# Patient Record
Sex: Female | Born: 1995 | Race: Black or African American | Hispanic: No | Marital: Single | Smoking: Light tobacco smoker
Health system: Southern US, Community
[De-identification: ages and names within clinical notes are randomized; demographics above are authoritative.]

## PROBLEM LIST (undated history)

## (undated) ENCOUNTER — Inpatient Hospital Stay (HOSPITAL_COMMUNITY): Payer: Self-pay

## (undated) DIAGNOSIS — G8929 Other chronic pain: Secondary | ICD-10-CM

## (undated) DIAGNOSIS — R109 Unspecified abdominal pain: Secondary | ICD-10-CM

## (undated) DIAGNOSIS — Z9109 Other allergy status, other than to drugs and biological substances: Secondary | ICD-10-CM

## (undated) DIAGNOSIS — Z531 Procedure and treatment not carried out because of patient's decision for reasons of belief and group pressure: Secondary | ICD-10-CM

## (undated) DIAGNOSIS — G43909 Migraine, unspecified, not intractable, without status migrainosus: Secondary | ICD-10-CM

## (undated) DIAGNOSIS — IMO0001 Reserved for inherently not codable concepts without codable children: Secondary | ICD-10-CM

## (undated) DIAGNOSIS — D649 Anemia, unspecified: Secondary | ICD-10-CM

## (undated) DIAGNOSIS — R51 Headache: Secondary | ICD-10-CM

## (undated) DIAGNOSIS — R569 Unspecified convulsions: Secondary | ICD-10-CM

## (undated) DIAGNOSIS — M549 Dorsalgia, unspecified: Secondary | ICD-10-CM

## (undated) HISTORY — DX: Anemia, unspecified: D64.9

## (undated) HISTORY — DX: Migraine, unspecified, not intractable, without status migrainosus: G43.909

## (undated) HISTORY — PX: DENTAL SURGERY: SHX609

---
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
## 2013-03-05 ENCOUNTER — Ambulatory Visit: Payer: 59 | Admitting: Obstetrics

## 2013-03-06 ENCOUNTER — Encounter: Payer: Self-pay | Admitting: Obstetrics

## 2013-03-06 ENCOUNTER — Ambulatory Visit (INDEPENDENT_AMBULATORY_CARE_PROVIDER_SITE_OTHER): Payer: 59 | Admitting: Obstetrics

## 2013-03-06 DIAGNOSIS — Z3041 Encounter for surveillance of contraceptive pills: Secondary | ICD-10-CM

## 2013-03-06 DIAGNOSIS — Z0000 Encounter for general adult medical examination without abnormal findings: Secondary | ICD-10-CM

## 2013-03-06 MED ORDER — PNV PRENATAL PLUS MULTIVITAMIN 27-1 MG PO TABS: 1.0000 | ORAL_TABLET | Freq: Every day | ORAL | Status: DC

## 2013-03-06 MED ORDER — NORETHINDRONE 0.35 MG PO TABS: 1.0000 | ORAL_TABLET | Freq: Every day | ORAL | Status: DC

## 2013-03-06 NOTE — Progress Notes (Signed)
Subjective:     Rebecca Cervantes is a 18 y.o. female here for a routine exam.  Current complaints: patient in office for a birth control consult. Patient is currently on the mini pill. Patient is still breastfeeding and wants to continue. Patient is interested in changing her birth control method.   Personal health questionnaire reviewed: yes.   Gynecologic History Patient's last menstrual period was 02/23/2013. Contraception: oral progesterone-only contraceptive  Obstetric History OB History  Gravida Para Term Preterm AB SAB TAB Ectopic Multiple Living  1 1 1  0 0 0 0 0 0 1    # Outcome Date GA Lbr Len/2nd Weight Sex Delivery Anes PTL Lv  1 TRM 01/14/12 [id number] 12:04 / 05:53 7 lb 13.9 oz (3.569 kg) M SVD EPI  Y       The following portions of the patient's history were reviewed and updated as appropriate: allergies, current medications, past family history, past medical history, past social history, past surgical history and problem list.  Review of Systems Pertinent items are noted in HPI.    Objective:    No exam performed today, Consultation for contraception..    Assessment:    Contraceptive surveillance.  Wants to continue Micronor.   Plan:    Education reviewed: safe sex/STD prevention. Contraception: oral progesterone-only contraceptive.

## 2013-08-23 ENCOUNTER — Emergency Department (HOSPITAL_COMMUNITY)
Admission: EM | Admit: 2013-08-23 | Discharge: 2013-08-23 | Disposition: A | Discharge status: Home / Self Care | Payer: 59 | Attending: Emergency Medicine | Admitting: Emergency Medicine

## 2013-08-23 ENCOUNTER — Encounter (HOSPITAL_COMMUNITY): Payer: Self-pay | Admitting: Emergency Medicine

## 2013-08-23 ENCOUNTER — Emergency Department (HOSPITAL_COMMUNITY): Payer: 59

## 2013-08-23 DIAGNOSIS — Z862 Personal history of diseases of the blood and blood-forming organs and certain disorders involving the immune mechanism: Secondary | ICD-10-CM | POA: Insufficient documentation

## 2013-08-23 DIAGNOSIS — R112 Nausea with vomiting, unspecified: Secondary | ICD-10-CM | POA: Insufficient documentation

## 2013-08-23 DIAGNOSIS — R1013 Epigastric pain: Secondary | ICD-10-CM | POA: Insufficient documentation

## 2013-08-23 DIAGNOSIS — G8929 Other chronic pain: Secondary | ICD-10-CM | POA: Insufficient documentation

## 2013-08-23 DIAGNOSIS — Z8679 Personal history of other diseases of the circulatory system: Secondary | ICD-10-CM | POA: Insufficient documentation

## 2013-08-23 DIAGNOSIS — Z3202 Encounter for pregnancy test, result negative: Secondary | ICD-10-CM | POA: Insufficient documentation

## 2013-08-23 HISTORY — DX: Unspecified abdominal pain: R10.9

## 2013-08-23 HISTORY — DX: Other chronic pain: G89.29

## 2013-08-23 LAB — POC URINE PREG, ED: Preg Test, Ur: NEGATIVE

## 2013-08-23 LAB — CBC WITH DIFFERENTIAL/PLATELET
BASOS PCT: 1 % (ref 0–1)
Basophils Absolute: 0 10*3/uL (ref 0.0–0.1)
Eosinophils Absolute: 0.2 10*3/uL (ref 0.0–0.7)
Eosinophils Relative: 2 % (ref 0–5)
HCT: 39.4 % (ref 36.0–46.0)
HEMOGLOBIN: 13.7 g/dL (ref 12.0–15.0)
LYMPHS PCT: 41 % (ref 12–46)
Lymphs Abs: 3 10*3/uL (ref 0.7–4.0)
MCH: 33.4 pg (ref 26.0–34.0)
MCHC: 34.8 g/dL (ref 30.0–36.0)
MCV: 96.1 fL (ref 78.0–100.0)
MONOS PCT: 5 % (ref 3–12)
Monocytes Absolute: 0.4 10*3/uL (ref 0.1–1.0)
NEUTROS ABS: 3.8 10*3/uL (ref 1.7–7.7)
Neutrophils Relative %: 51 % (ref 43–77)
Platelets: 221 10*3/uL (ref 150–400)
RBC: 4.1 MIL/uL (ref 3.87–5.11)
RDW: 12.7 % (ref 11.5–15.5)
WBC: 7.3 10*3/uL (ref 4.0–10.5)

## 2013-08-23 LAB — URINALYSIS, ROUTINE W REFLEX MICROSCOPIC
BILIRUBIN URINE: NEGATIVE
GLUCOSE, UA: NEGATIVE mg/dL
HGB URINE DIPSTICK: NEGATIVE
Ketones, ur: 40 mg/dL — AB
Nitrite: NEGATIVE
PH: 8 (ref 5.0–8.0)
Protein, ur: NEGATIVE mg/dL
SPECIFIC GRAVITY, URINE: 1.027 (ref 1.005–1.030)
Urobilinogen, UA: 1 mg/dL (ref 0.0–1.0)

## 2013-08-23 LAB — COMPREHENSIVE METABOLIC PANEL
ALBUMIN: 4.4 g/dL (ref 3.5–5.2)
ALT: 14 U/L (ref 0–35)
AST: 18 U/L (ref 0–37)
Alkaline Phosphatase: 70 U/L (ref 39–117)
Anion gap: 14 (ref 5–15)
BILIRUBIN TOTAL: 0.8 mg/dL (ref 0.3–1.2)
BUN: 12 mg/dL (ref 6–23)
CHLORIDE: 101 meq/L (ref 96–112)
CO2: 25 mEq/L (ref 19–32)
Calcium: 9.2 mg/dL (ref 8.4–10.5)
Creatinine, Ser: 0.82 mg/dL (ref 0.50–1.10)
GFR calc Af Amer: >90 mL/min (ref >90)
GFR calc non Af Amer: >90 mL/min (ref >90)
Glucose, Bld: 79 mg/dL (ref 70–99)
Potassium: 4 mEq/L (ref 3.7–5.3)
Sodium: 140 mEq/L (ref 137–147)
Total Protein: 7.7 g/dL (ref 6.0–8.3)

## 2013-08-23 LAB — URINE MICROSCOPIC-ADD ON

## 2013-08-23 LAB — LIPASE, BLOOD: Lipase: 20 U/L (ref 11–59)

## 2013-08-23 MED ORDER — HYOSCYAMINE SULFATE 0.125 MG PO TABS
0.1250 mg | ORAL_TABLET | Freq: Once | ORAL | Status: DC
  Filled 2013-08-23: qty 1

## 2013-08-23 MED ORDER — GI COCKTAIL ~~LOC~~
30.0000 mL | Freq: Once | ORAL | Status: AC
Start: 2013-08-23 — End: 2013-08-23
  Administered 2013-08-23: 30 mL via ORAL
  Filled 2013-08-23: qty 30

## 2013-08-23 MED ORDER — ONDANSETRON HCL 4 MG/2ML IJ SOLN
4.0000 mg | Freq: Once | INTRAMUSCULAR | Status: AC
  Administered 2013-08-23: 4 mg via INTRAVENOUS
  Filled 2013-08-23: qty 2

## 2013-08-23 MED ORDER — HYOSCYAMINE SULFATE 0.125 MG PO TABS: 0.1250 mg | ORAL_TABLET | Freq: Once | ORAL | Status: DC

## 2013-08-23 MED ORDER — HYOSCYAMINE SULFATE 0.125 MG PO TABS
0.2500 mg | ORAL_TABLET | Freq: Once | ORAL | Status: DC
  Filled 2013-08-23 (×2): qty 2

## 2013-08-23 MED ORDER — SUCRALFATE 1 GM/10ML PO SUSP: 1.0000 g | Freq: Three times a day (TID) | ORAL | Status: DC

## 2013-08-23 MED ORDER — HYOSCYAMINE SULFATE 0.125 MG SL SUBL: 0.1250 mg | SUBLINGUAL_TABLET | SUBLINGUAL | Status: DC | PRN

## 2013-08-23 MED ORDER — FAMOTIDINE 20 MG PO TABS: 20.0000 mg | ORAL_TABLET | Freq: Two times a day (BID) | ORAL | Status: DC

## 2013-08-23 MED ORDER — HYOSCYAMINE SULFATE 0.125 MG SL SUBL
0.2500 mg | SUBLINGUAL_TABLET | SUBLINGUAL | Status: AC
  Administered 2013-08-23: 0.25 mg via SUBLINGUAL

## 2013-08-23 NOTE — ED Provider Notes (Signed)
Medical screening examination/treatment/procedure(s) were performed by non-physician practitioner and as supervising physician I was immediately available for consultation/collaboration.   EKG Interpretation None        [name] [name], MD 08/23/13 2029 

## 2013-08-23 NOTE — Discharge Instructions (Signed)
Start taking pepcid daily. Take carafate as needed. Take levsin as needed. Follow up with gastroenterologist or primary care doctor.    Abdominal Pain Many things can cause abdominal pain. Usually, abdominal pain is not caused by a disease and will improve without treatment. It can often be observed and treated at home. Your health care provider will do a physical exam and possibly order blood tests and X-rays to help determine the seriousness of your pain. However, in many cases, more time must pass before a clear cause of the pain can be found. Before that point, your health care provider may not know if you need more testing or further treatment. HOME CARE INSTRUCTIONS  Monitor your abdominal pain for any changes. The following actions may help to alleviate any discomfort you are experiencing:  Only take over-the-counter or prescription medicines as directed by your health care provider.  Do not take laxatives unless directed to do so by your health care provider.  Try a clear liquid diet (broth, tea, or water) as directed by your health care provider. Slowly move to a bland diet as tolerated. SEEK MEDICAL CARE IF:  You have unexplained abdominal pain.  You have abdominal pain associated with nausea or diarrhea.  You have pain when you urinate or have a bowel movement.  You experience abdominal pain that wakes you in the night.  You have abdominal pain that is worsened or improved by eating food.  You have abdominal pain that is worsened with eating fatty foods.  You have a fever. SEEK IMMEDIATE MEDICAL CARE IF:   Your pain does not go away within 2 hours.  You keep throwing up (vomiting).  Your pain is felt only in portions of the abdomen, such as the right side or the left lower portion of the abdomen.  You pass bloody or black tarry stools. MAKE SURE YOU:  Understand these instructions.   Will watch your condition.   Will get help right away if you are not doing  well or get worse.  Document Released: 11/01/2004 Document Revised: 01/27/2013 Document Reviewed: 10/01/2012 [facility] Patient Information 2015 [employer] [place], [place]. This information is not intended to replace advice given to you by your health care provider. Make sure you discuss any questions you have with your health care provider.

## 2013-08-23 NOTE — ED Provider Notes (Signed)
CSN: [id number]     Arrival date & time 08/23/13  0841 History   First MD Initiated Contact with Patient 08/23/13 [id number]     Chief Complaint  Patient presents with  . Abdominal Pain  . Emesis     (Consider location/radiation/quality/duration/timing/severity/associated sxs/prior Treatment) HPI Rebecca Cervantes is a 18 y.o. female who presents to emergency department complaining of abdominal pain. Patient with chronic abdominal issues, states has had problems with her abdomen for 5 years. Has been seen at [facility] for the same, had an endoscopy and multiple evaluations a year ago. Was told everything was normal. She was started on PPIs which she's not taking at this time. States she has pain daily, but able to manage it, states that the pain worsened in the last 3 days. Patient reports episode of emesis yesterday and today. She reports nausea. Normal bowel movement yesterday. Denies any urinary or vaginal complaints. No possibility of being pregnant. Did not try taking any medications for the same. Pain is mainly epigastric, does not radiate. It is sharp. States she is able to eat and drink well. Nothing making symptoms better or worse. No prior abdominal surgeries.  Past Medical History  Diagnosis Date  . Environmental allergies   . Chronic back pain   . Headache(784.0)     Migraines  . Seizures     last one 2 years ago  . Refusal of blood transfusions as patient is Jehovah's Witness   . Anemia   . Chronic abdominal pain    Past Surgical History  Procedure Laterality Date  . Dental surgery     Family History  Problem Relation Age of Onset  . Asthma Mother   . Hypertension Mother   . Other Mother     woke up with anesthesia  . Hypertension Sister   . Hypertension Maternal Grandmother    History  Substance Use Topics  . Smoking status: Never Smoker   . Smokeless tobacco: Never Used  . Alcohol Use: No   OB History   Grav Para Term Preterm Abortions TAB SAB  Ect Mult Living   1 1 1  0 0 0 0 0 0 1     Review of Systems  Constitutional: Negative for fever and chills.  Respiratory: Negative for cough, chest tightness and shortness of breath.   Cardiovascular: Negative for chest pain, palpitations and leg swelling.  Gastrointestinal: Positive for nausea, vomiting and abdominal pain. Negative for diarrhea.  Genitourinary: Negative for dysuria, flank pain, vaginal bleeding, vaginal discharge, vaginal pain and pelvic pain.  Musculoskeletal: Negative for arthralgias, myalgias, neck pain and neck stiffness.  Skin: Negative for rash.  Neurological: Negative for dizziness, weakness and headaches.  All other systems reviewed and are negative.     Allergies  Morphine and related  Home Medications   Prior to Admission medications   Medication Sig Start Date End Date Taking? Authorizing Provider  PRESCRIPTION MEDICATION Take 1 tablet by mouth daily. "minipill" birth control   Yes Historical Provider, MD   BP 110/86  Temp(Src) 98.3 F (36.8 C) (Oral)  Resp 21  SpO2 100%  LMP 08/02/2013 Physical Exam  Nursing note and vitals reviewed. Constitutional: She appears well-developed and well-nourished. No distress.  HENT:  Head: Normocephalic.  Eyes: Conjunctivae are normal.  Neck: Neck supple.  Cardiovascular: Normal rate, regular rhythm and normal heart sounds.   Pulmonary/Chest: Effort normal and breath sounds normal. No respiratory distress. She has no wheezes. She has no rales.  Abdominal:  Soft. Bowel sounds are normal. She exhibits no distension. There is tenderness. There is no rebound and no guarding.  Epigastric tenderness  Musculoskeletal: She exhibits no edema.  Neurological: She is alert.  Skin: Skin is warm and dry.  Psychiatric: She has a normal mood and affect. Her behavior is normal.    ED Course  Procedures (including critical care time) Labs Review Labs Reviewed  URINALYSIS, ROUTINE W REFLEX MICROSCOPIC - Abnormal; Notable  for the following:    Color, Urine AMBER (*)    Ketones, ur 40 (*)    Leukocytes, UA SMALL (*)    All other components within normal limits  URINE MICROSCOPIC-ADD ON - Abnormal; Notable for the following:    Squamous Epithelial / LPF FEW (*)    All other components within normal limits  CBC WITH DIFFERENTIAL  COMPREHENSIVE METABOLIC PANEL  LIPASE, BLOOD  POC URINE PREG, ED    Imaging Review US Abdomen Complete  08/23/2013   CLINICAL DATA:  Abdominal pain.  Vomiting.  EXAM: ULTRASOUND ABDOMEN COMPLETE  COMPARISON:  None.  FINDINGS: Gallbladder:  No gallstones or wall thickening visualized. No sonographic Murphy sign noted.  Common bile duct:  Diameter: 1.4 mm  Liver:  No focal lesion identified. Within normal limits in parenchymal echogenicity.  IVC:  No abnormality visualized.  Pancreas:  Visualized portion unremarkable.  Spleen:  Size and appearance within normal limits.  Right Kidney:  Length: 9.1 cm. Echogenicity within normal limits. No mass or hydronephrosis visualized.  Left Kidney:  Length: 9.5 cm. Echogenicity within normal limits. No mass or hydronephrosis visualized.  Abdominal aorta:  No aneurysm visualized.  Other findings:  None.  IMPRESSION: Normal abdominal ultrasound.   Electronically Signed   By: Amie Portland M.D.   On: 08/23/2013 13:11     EKG Interpretation None      MDM   Final diagnoses:  Epigastric pain   Patient epigastric abdominal pain for "years." Worsened in the last several days. Recent endoscopy last year which was normal. She was diagnosed with acid reflux in the past. She reports normal bowel movements. We'll try GI cocktail and Levsin.  11:18 AM Pt feeling better after levsin and GI cocktail. Will get [place]. Labs and UA normal.    1:29 PM [place] normal. Discussed with pt, starting back on pepcid, carafate, and levsin. Pt will need follow up with her PCp and GI. Her GI doctor is in [place], but she lives here now. Requested referral to GI specialist in  [place].   Filed Vitals:   08/23/13 1000 08/23/13 1130 08/23/13 1200 08/23/13 1337  BP: 109/61 101/62 99/59 98/58   Pulse: 63 71 56   Temp:      TempSrc:      Resp: 18 18 17 14   SpO2: 100% 99% 100% 100%     Lottie Mussel, PA-C 08/23/13 1533

## 2013-08-23 NOTE — ED Notes (Signed)
Nurse was informed pt. In room.

## 2013-08-23 NOTE — ED Notes (Signed)
Patient transported to Ultrasound 

## 2013-08-23 NOTE — ED Notes (Signed)
Pt from home with c/o chronic abdominal pain worsening the last several days, vomiting started last night.  Denies diarrhea, reports hx of same x 5 years with no dx.  Intermittently sees GI dr.  Pt in NAD, A&O.

## 2013-11-10 ENCOUNTER — Ambulatory Visit: Payer: 59 | Admitting: Obstetrics

## 2013-11-22 ENCOUNTER — Encounter (HOSPITAL_COMMUNITY): Payer: Self-pay | Admitting: Emergency Medicine

## 2013-11-22 DIAGNOSIS — R110 Nausea: Secondary | ICD-10-CM | POA: Diagnosis not present

## 2013-11-22 DIAGNOSIS — G8929 Other chronic pain: Secondary | ICD-10-CM | POA: Insufficient documentation

## 2013-11-22 DIAGNOSIS — R1010 Upper abdominal pain, unspecified: Secondary | ICD-10-CM | POA: Insufficient documentation

## 2013-11-22 LAB — CBC WITH DIFFERENTIAL/PLATELET
BASOS ABS: 0 10*3/uL (ref 0.0–0.1)
BASOS PCT: 1 % (ref 0–1)
Eosinophils Absolute: 0.1 10*3/uL (ref 0.0–0.7)
Eosinophils Relative: 2 % (ref 0–5)
HCT: 36.9 % (ref 36.0–46.0)
Hemoglobin: 12.4 g/dL (ref 12.0–15.0)
Lymphocytes Relative: 45 % (ref 12–46)
Lymphs Abs: 2.7 10*3/uL (ref 0.7–4.0)
MCH: 32 pg (ref 26.0–34.0)
MCHC: 33.6 g/dL (ref 30.0–36.0)
MCV: 95.3 fL (ref 78.0–100.0)
MONO ABS: 0.4 10*3/uL (ref 0.1–1.0)
Monocytes Relative: 7 % (ref 3–12)
NEUTROS ABS: 2.8 10*3/uL (ref 1.7–7.7)
Neutrophils Relative %: 45 % (ref 43–77)
Platelets: 202 10*3/uL (ref 150–400)
RBC: 3.87 MIL/uL (ref 3.87–5.11)
RDW: 12.5 % (ref 11.5–15.5)
WBC: 6 10*3/uL (ref 4.0–10.5)

## 2013-11-22 LAB — COMPREHENSIVE METABOLIC PANEL
ALBUMIN: 3.9 g/dL (ref 3.5–5.2)
ALT: 9 U/L (ref 0–35)
AST: 15 U/L (ref 0–37)
Alkaline Phosphatase: 65 U/L (ref 39–117)
Anion gap: 11 (ref 5–15)
BUN: 13 mg/dL (ref 6–23)
CO2: 26 mEq/L (ref 19–32)
CREATININE: 0.91 mg/dL (ref 0.50–1.10)
Calcium: 9.3 mg/dL (ref 8.4–10.5)
Chloride: 101 mEq/L (ref 96–112)
GFR calc Af Amer: >90 mL/min (ref >90)
GFR calc non Af Amer: >90 mL/min (ref >90)
Glucose, Bld: 88 mg/dL (ref 70–99)
Potassium: 3.5 mEq/L — ABNORMAL LOW (ref 3.7–5.3)
Sodium: 138 mEq/L (ref 137–147)
Total Bilirubin: <0.2 mg/dL — ABNORMAL LOW (ref 0.3–1.2)
Total Protein: 7.7 g/dL (ref 6.0–8.3)

## 2013-11-22 LAB — LIPASE, BLOOD: LIPASE: 18 U/L (ref 11–59)

## 2013-11-22 NOTE — ED Notes (Signed)
C/o upper abd pain that radiates across back x 5 days with nausea.  Denies urinary complaints, vomiting, diarrhea, or constipation.

## 2013-11-23 ENCOUNTER — Emergency Department (HOSPITAL_COMMUNITY)
Admission: EM | Admit: 2013-11-23 | Discharge: 2013-11-23 | Discharge status: Against Medical Advice | Payer: 59 | Attending: Emergency Medicine | Admitting: Emergency Medicine

## 2013-12-07 ENCOUNTER — Encounter (HOSPITAL_COMMUNITY): Payer: Self-pay | Admitting: Emergency Medicine

## 2013-12-07 ENCOUNTER — Encounter: Payer: 59 | Admitting: Family

## 2013-12-07 DIAGNOSIS — Z0289 Encounter for other administrative examinations: Secondary | ICD-10-CM

## 2013-12-08 NOTE — Progress Notes (Signed)
ERROR

## 2013-12-08 NOTE — Patient Instructions (Signed)
ERROR

## 2013-12-28 ENCOUNTER — Encounter: Payer: Self-pay | Admitting: Family

## 2013-12-28 ENCOUNTER — Ambulatory Visit (INDEPENDENT_AMBULATORY_CARE_PROVIDER_SITE_OTHER): Payer: 59 | Admitting: Family

## 2013-12-28 DIAGNOSIS — G8929 Other chronic pain: Secondary | ICD-10-CM

## 2013-12-28 DIAGNOSIS — R109 Unspecified abdominal pain: Secondary | ICD-10-CM

## 2013-12-28 MED ORDER — FAMOTIDINE 20 MG PO TABS: 20.0000 mg | ORAL_TABLET | Freq: Two times a day (BID) | ORAL | Status: DC

## 2013-12-28 NOTE — Progress Notes (Signed)
Pre visit review using our clinic review tool, if applicable. No additional management support is needed unless otherwise documented below in the visit note. 

## 2013-12-28 NOTE — Assessment & Plan Note (Signed)
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 Has not been taking medications secondary to cost. Discussed famotidine with patient and on $4 list. No signs of acute abdomen or changes in bowel habits. Will refer [facility] per patient request. Follow up based on referral.

## 2013-12-28 NOTE — Patient Instructions (Addendum)
Thank you for choosing [facility].  Summary/Instructions:  Your prescription(s) have been submitted to your pharmacy. Please take as directed and contact our office if you believe you are having problem(s) with the medication(s).  Referrals have been made during this visit. You should expect to hear back from our schedulers in about 7-10 days in regards to establishing an appointment with the specialists we discussed.   If your symptoms worsen or fail to improve, please contact our office for further instruction, or in case of emergency go directly to the emergency room at the closest medical facility.     

## 2013-12-28 NOTE — Progress Notes (Signed)
Subjective:    Patient ID: Rebecca Cervantes, female    DOB: 1995/08/01, 18 y.o.   MRN: [id number]  Chief Complaint  Patient presents with  . Establish Care    having problems with stomach pain, would like to be referred to GI, also concerned about being underweight    HPI:  Rebecca Cervantes is a 18 y.o. female who presents today to establish care and discuss stomach issues and inability to gain weight.   1) Stomach issues - chronic stomach issues she was in the 8th grade, a constant pain/soreness rated at 6/10 in intensity. Has been tested for IBS, Chron's. Has had previous imaging and multiple scans. Eating makes the pain worse, so she does not eat until the pain is feeling better. Has previously been seen by Dr. Rae LipsFortunada at [facility]. Able to eat waffles, fruits, crab legs, hot pockets with minimal discomfort. Previously seen by GI and recommended she follow up with [facility], which has not yet occurred. Recent ED visit documentation reviewed.   Allergies  Allergen Reactions  . Morphine And Related Hives   Current Outpatient Prescriptions on File Prior to Visit  Medication Sig Dispense Refill  . PRESCRIPTION MEDICATION Take 1 tablet by mouth daily. "minipill" birth control    . hyoscyamine (LEVSIN/SL) 0.125 MG SL tablet Place 1 tablet (0.125 mg total) under the tongue every 4 (four) hours as needed. (Patient not taking: Reported on 12/28/2013) 30 tablet 0  . sucralfate (CARAFATE) 1 GM/10ML suspension Take 10 mLs (1 g total) by mouth 4 (four) times daily -  with meals and at bedtime. (Patient not taking: Reported on 12/28/2013) 420 mL 0   No current facility-administered medications on file prior to visit.   Past Medical History  Diagnosis Date  . Environmental allergies   . Chronic back pain   . Headache(784.0)     Migraines  . Refusal of blood transfusions as patient is Jehovah's Witness   . Anemia   . Chronic abdominal pain   . Seizures    last one 2 years ago  . Migraine    Family History  Problem Relation Age of Onset  . Asthma Mother   . Hypertension Mother   . Other Mother     woke up with anesthesia  . Hypertension Sister   . Hypertension Maternal Grandmother    History   Social History  . Marital Status: Single    Spouse Name: N/A    Number of Children: 1  . Years of Education: 12   Occupational History  . Unemployed    Social History Main Topics  . Smoking status: Never Smoker   . Smokeless tobacco: Never Used  . Alcohol Use: No  . Drug Use: 2.00 per week    Special: Marijuana  . Sexual Activity:    Partners: Male    Birth Control/ Protection: Pill   Other Topics Concern  . Not on file   Social History Narrative   Born [place] raised in [place], [place]. Lives with her parents and her son. Fun: Get away.    Jevohavah's witness - does not accept blood products.     Review of Systems    See HPI Objective:    BP 112/70 mmHg  Pulse 67  Temp(Src) 98.5 F (36.9 C) (Oral)  Resp 18  Ht 5\' 1"  (1.549 m)  Wt 90 lb 1.9 oz (40.878 kg)  BMI 17.04 kg/m2  SpO2 95% Nursing note and vital  signs reviewed.  Physical Exam  Constitutional: She is oriented to person, place, and time. She appears well-developed. No distress.  Thin appearing female. Seated on the exam table, dressed appropriate, nasal piercing, and appears stated age.   Cardiovascular: Normal rate, regular rhythm, normal heart sounds and intact distal pulses.   Pulmonary/Chest: Effort normal and breath sounds normal.  Abdominal: Normal appearance. There is no hepatosplenomegaly or hepatomegaly. There is generalized tenderness. There is no rigidity, no rebound, no guarding, no CVA tenderness, no tenderness at McBurney's point and negative Murphy's sign.  Neurological: She is alert and oriented to person, place, and time.  Skin: Skin is warm and dry.  Psychiatric: She has a normal mood and affect. Her behavior is normal. Judgment  and thought content normal.       Assessment & Plan:

## 2014-01-14 ENCOUNTER — Telehealth: Payer: Self-pay | Admitting: *Deleted

## 2014-01-14 NOTE — Telephone Encounter (Signed)
Patient called requesting a refill on her birth control.  Attempted to contact the patient and left message with her father for her to call the office.

## 2014-01-20 ENCOUNTER — Other Ambulatory Visit: Payer: Self-pay | Admitting: *Deleted

## 2014-01-20 DIAGNOSIS — Z3041 Encounter for surveillance of contraceptive pills: Secondary | ICD-10-CM

## 2014-01-20 MED ORDER — NORETHINDRONE 0.35 MG PO TABS: 1.0000 | ORAL_TABLET | Freq: Every day | ORAL | Status: DC

## 2014-01-20 NOTE — Telephone Encounter (Signed)
Refills have been forwarded to patient's pharmacy. She does need to make an annual appointment- but it can be after February.

## 2014-03-08 ENCOUNTER — Other Ambulatory Visit: Payer: Self-pay | Admitting: *Deleted

## 2014-03-08 MED ORDER — NORETHINDRONE 0.35 MG PO TABS: 1.0000 | ORAL_TABLET | Freq: Every day | ORAL | Status: DC

## 2014-07-15 ENCOUNTER — Encounter: Payer: Self-pay | Admitting: Family

## 2014-07-15 ENCOUNTER — Telehealth: Payer: Self-pay | Admitting: Family

## 2014-07-15 DIAGNOSIS — Z0289 Encounter for other administrative examinations: Secondary | ICD-10-CM

## 2014-07-15 NOTE — Telephone Encounter (Signed)
May reschedule if she calls back.  

## 2014-07-15 NOTE — Telephone Encounter (Signed)
Patient no showed for cpe. Please advise. °

## 2014-07-16 NOTE — Telephone Encounter (Signed)
Noted  

## 2014-08-16 ENCOUNTER — Emergency Department (HOSPITAL_COMMUNITY)
Admission: EM | Admit: 2014-08-16 | Discharge: 2014-08-16 | Disposition: A | Discharge status: Home / Self Care | Payer: BLUE CROSS/BLUE SHIELD | Source: Home / Self Care | Attending: Emergency Medicine | Admitting: Emergency Medicine

## 2014-08-16 ENCOUNTER — Encounter (HOSPITAL_COMMUNITY): Payer: Self-pay

## 2014-08-16 ENCOUNTER — Telehealth: Payer: Self-pay | Admitting: Family

## 2014-08-16 ENCOUNTER — Observation Stay (HOSPITAL_COMMUNITY)
Admission: EM | Admit: 2014-08-16 | Discharge: 2014-08-17 | Discharge status: Against Medical Advice | Payer: BLUE CROSS/BLUE SHIELD | Attending: Internal Medicine | Admitting: Internal Medicine

## 2014-08-16 ENCOUNTER — Observation Stay (HOSPITAL_COMMUNITY): Payer: BLUE CROSS/BLUE SHIELD

## 2014-08-16 ENCOUNTER — Encounter (HOSPITAL_COMMUNITY): Payer: Self-pay | Admitting: Emergency Medicine

## 2014-08-16 DIAGNOSIS — R569 Unspecified convulsions: Secondary | ICD-10-CM | POA: Diagnosis not present

## 2014-08-16 DIAGNOSIS — G43909 Migraine, unspecified, not intractable, without status migrainosus: Secondary | ICD-10-CM | POA: Diagnosis not present

## 2014-08-16 DIAGNOSIS — M549 Dorsalgia, unspecified: Secondary | ICD-10-CM | POA: Diagnosis not present

## 2014-08-16 DIAGNOSIS — F1210 Cannabis abuse, uncomplicated: Secondary | ICD-10-CM | POA: Insufficient documentation

## 2014-08-16 DIAGNOSIS — G8929 Other chronic pain: Secondary | ICD-10-CM

## 2014-08-16 DIAGNOSIS — F445 Conversion disorder with seizures or convulsions: Secondary | ICD-10-CM | POA: Insufficient documentation

## 2014-08-16 DIAGNOSIS — Z862 Personal history of diseases of the blood and blood-forming organs and certain disorders involving the immune mechanism: Secondary | ICD-10-CM

## 2014-08-16 DIAGNOSIS — E876 Hypokalemia: Secondary | ICD-10-CM | POA: Diagnosis not present

## 2014-08-16 DIAGNOSIS — R4182 Altered mental status, unspecified: Secondary | ICD-10-CM | POA: Insufficient documentation

## 2014-08-16 DIAGNOSIS — Z8679 Personal history of other diseases of the circulatory system: Secondary | ICD-10-CM

## 2014-08-16 DIAGNOSIS — Z885 Allergy status to narcotic agent status: Secondary | ICD-10-CM | POA: Insufficient documentation

## 2014-08-16 DIAGNOSIS — Z79899 Other long term (current) drug therapy: Secondary | ICD-10-CM

## 2014-08-16 DIAGNOSIS — D649 Anemia, unspecified: Secondary | ICD-10-CM | POA: Diagnosis not present

## 2014-08-16 DIAGNOSIS — F449 Dissociative and conversion disorder, unspecified: Secondary | ICD-10-CM

## 2014-08-16 LAB — CBC WITH DIFFERENTIAL/PLATELET
Basophils Absolute: 0 10*3/uL (ref 0.0–0.1)
Basophils Relative: 1 % (ref 0–1)
Eosinophils Absolute: 0.2 10*3/uL (ref 0.0–0.7)
Eosinophils Relative: 3 % (ref 0–5)
HCT: 42.6 % (ref 36.0–46.0)
Hemoglobin: 15.2 g/dL — ABNORMAL HIGH (ref 12.0–15.0)
Lymphocytes Relative: 45 % (ref 12–46)
Lymphs Abs: 3.5 10*3/uL (ref 0.7–4.0)
MCH: 34.5 pg — ABNORMAL HIGH (ref 26.0–34.0)
MCHC: 35.7 g/dL (ref 30.0–36.0)
MCV: 96.6 fL (ref 78.0–100.0)
Monocytes Absolute: 0.5 10*3/uL (ref 0.1–1.0)
Monocytes Relative: 6 % (ref 3–12)
Neutro Abs: 3.6 10*3/uL (ref 1.7–7.7)
Neutrophils Relative %: 45 % (ref 43–77)
Platelets: 206 10*3/uL (ref 150–400)
RBC: 4.41 MIL/uL (ref 3.87–5.11)
RDW: 12.1 % (ref 11.5–15.5)
WBC: 7.8 10*3/uL (ref 4.0–10.5)

## 2014-08-16 LAB — BASIC METABOLIC PANEL
Anion gap: 11 (ref 5–15)
BUN: 12 mg/dL (ref 6–20)
CO2: 23 mmol/L (ref 22–32)
Calcium: 9.4 mg/dL (ref 8.9–10.3)
Chloride: 107 mmol/L (ref 101–111)
Creatinine, Ser: 0.95 mg/dL (ref 0.44–1.00)
GFR calc Af Amer: >60 mL/min (ref >60)
GFR calc non Af Amer: >60 mL/min (ref >60)
Glucose, Bld: 80 mg/dL (ref 65–99)
Potassium: 3.3 mmol/L — ABNORMAL LOW (ref 3.5–5.1)
Sodium: 141 mmol/L (ref 135–145)

## 2014-08-16 LAB — PREGNANCY, URINE: PREG TEST UR: NEGATIVE

## 2014-08-16 LAB — RAPID URINE DRUG SCREEN, HOSP PERFORMED
AMPHETAMINES: NOT DETECTED
BENZODIAZEPINES: POSITIVE — AB
Barbiturates: NOT DETECTED
Cocaine: NOT DETECTED
Opiates: NOT DETECTED
Tetrahydrocannabinol: POSITIVE — AB

## 2014-08-16 MED ORDER — SODIUM CHLORIDE 0.9 % IV SOLN
INTRAVENOUS | Status: DC
  Administered 2014-08-16: 22:00:00 via INTRAVENOUS

## 2014-08-16 MED ORDER — FAMOTIDINE 20 MG PO TABS
20.0000 mg | ORAL_TABLET | Freq: Two times a day (BID) | ORAL | Status: DC
  Administered 2014-08-16: 20 mg via ORAL
  Filled 2014-08-16: qty 1

## 2014-08-16 MED ORDER — FOLIC ACID 1 MG PO TABS: 1.0000 mg | ORAL_TABLET | Freq: Every day | ORAL | Status: DC

## 2014-08-16 MED ORDER — KETOROLAC TROMETHAMINE 15 MG/ML IJ SOLN
15.0000 mg | Freq: Once | INTRAMUSCULAR | Status: AC
  Administered 2014-08-16: 15 mg via INTRAVENOUS
  Filled 2014-08-16: qty 1

## 2014-08-16 MED ORDER — NORETHINDRONE 0.35 MG PO TABS: 1.0000 | ORAL_TABLET | Freq: Every day | ORAL | Status: DC

## 2014-08-16 MED ORDER — HYDROMORPHONE HCL 2 MG/ML IJ SOLN: 2.0000 mg | Freq: Once | INTRAMUSCULAR | Status: DC

## 2014-08-16 MED ORDER — POTASSIUM CHLORIDE CRYS ER 20 MEQ PO TBCR
40.0000 meq | EXTENDED_RELEASE_TABLET | Freq: Once | ORAL | Status: AC
  Administered 2014-08-16: 40 meq via ORAL
  Filled 2014-08-16: qty 2

## 2014-08-16 MED ORDER — VITAMIN B-1 100 MG PO TABS
100.0000 mg | ORAL_TABLET | Freq: Every day | ORAL | Status: DC
  Filled 2014-08-16: qty 1

## 2014-08-16 MED ORDER — ACETAMINOPHEN 650 MG RE SUPP: 650.0000 mg | Freq: Four times a day (QID) | RECTAL | Status: DC | PRN

## 2014-08-16 MED ORDER — ADULT MULTIVITAMIN W/MINERALS CH
1.0000 | ORAL_TABLET | Freq: Every day | ORAL | Status: DC
  Filled 2014-08-16: qty 1

## 2014-08-16 MED ORDER — LORAZEPAM 2 MG/ML IJ SOLN
1.0000 mg | Freq: Once | INTRAMUSCULAR | Status: AC
  Administered 2014-08-16: 1 mg via INTRAVENOUS
  Filled 2014-08-16: qty 1

## 2014-08-16 MED ORDER — SODIUM CHLORIDE 0.9 % IJ SOLN
3.0000 mL | Freq: Two times a day (BID) | INTRAMUSCULAR | Status: DC
  Administered 2014-08-16: 3 mL via INTRAVENOUS

## 2014-08-16 MED ORDER — ACETAMINOPHEN 325 MG PO TABS
650.0000 mg | ORAL_TABLET | Freq: Four times a day (QID) | ORAL | Status: DC | PRN
  Administered 2014-08-16: 650 mg via ORAL
  Filled 2014-08-16: qty 2

## 2014-08-16 MED ORDER — ONDANSETRON 4 MG PO TBDP: 4.0000 mg | ORAL_TABLET | Freq: Once | ORAL | Status: DC

## 2014-08-16 NOTE — ED Notes (Signed)
Attempted to draw blood back off IV was not successful. Rebecca Cervantes made aware blood/lab pending

## 2014-08-16 NOTE — ED Notes (Signed)
MD at bedside. Dr Cyril Mourningamillo.

## 2014-08-16 NOTE — ED Notes (Signed)
Per family increased stress at home this week

## 2014-08-16 NOTE — ED Notes (Signed)
MD at bedside. 

## 2014-08-16 NOTE — ED Notes (Signed)
MD at bedside. EDP KOHUT PRESENT 

## 2014-08-16 NOTE — ED Notes (Signed)
Pt here with seizure like activity; pt with eye fluttering and shaking; pt seen at [facility] earlier today for same; pt diagnosed with pseudo seizure; pt not responding at present

## 2014-08-16 NOTE — ED Provider Notes (Signed)
CSN: [id number]     Arrival date & time 08/16/14  1827 History   First MD Initiated Contact with Patient 08/16/14 1847     Chief Complaint  Patient presents with  . Seizures    Level V caveat due to altered mental status. (Consider location/radiation/quality/duration/timing/severity/associated sxs/prior Treatment) Patient is a 19 y.o. female presenting with seizures. The history is provided by the patient and a relative.  Seizures  patient was seen earlier today at was a long diagnosis pseudoseizure/conversion. Family and patient were somewhat upset that she was not treated more. Tonight she began to have another episode where she had her eyes laying back and fluttering. She would not move her extremities. Would not talk and would not follow commands. 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 Also has questionable family history of seizures. On my examination she will not talk. Arms will not follow commands. His arm is held up she will hold stiff above her lateral later come down. He'll not drop onto her face. Later she will stare straight ahead but will not speak. She will squeeze hands to commands and when I asked what her name or she shrugged her shoulders.  Past Medical History  Diagnosis Date  . Environmental allergies   . Chronic back pain   . Headache(784.0)     Migraines  . Refusal of blood transfusions as patient is Jehovah's Witness   . Anemia   . Chronic abdominal pain   . Seizures     last one 2 years ago  . Migraine    Past Surgical History  Procedure Laterality Date  . Dental surgery     Family History  Problem Relation Age of Onset  . Asthma Mother   . Hypertension Mother   . Other Mother     woke up with anesthesia  . Hypertension Sister   . Hypertension Maternal Grandmother    History  Substance Use Topics  . Smoking status: Never Smoker   . Smokeless tobacco: Never Used  . Alcohol Use: No   OB History    Gravida Para Term Preterm  AB TAB SAB Ectopic Multiple Living   0 0 0 0 0 0 1     Review of Systems  Unable to perform ROS Neurological: Positive for seizures.      Allergies  Morphine and related  Home Medications   Prior to Admission medications   Medication Sig Start Date End Date Taking? Authorizing Provider  famotidine (PEPCID) 20 MG tablet Take 1 tablet (20 mg total) by mouth 2 (two) times daily. Patient not taking: Reported on 08/16/2014 12/28/13   Veryl Speak, FNP  hyoscyamine (LEVSIN/SL) 0.125 MG SL tablet Place 1 tablet (0.125 mg total) under the tongue every 4 (four) hours as needed. Patient not taking: Reported on 12/28/2013 08/23/13   Tatyana Kirichenko, PA-C  norethindrone (MICRONOR,CAMILA,ERRIN) 0.35 MG tablet Take 1 tablet (0.35 mg total) by mouth daily. Patient not taking: Reported on 08/16/2014 03/08/14   Brock Bad, MD  norethindrone (NORA-BE) 0.35 MG tablet Take 1 tablet by mouth daily.    Historical Provider, MD  sucralfate (CARAFATE) 1 GM/10ML suspension Take 10 mLs (1 g total) by mouth 4 (four) times daily -  with meals and at bedtime. Patient not taking: Reported on 12/28/2013 08/23/13   Tatyana Kirichenko, PA-C   BP 97/61 mmHg  Pulse 65  Temp(Src) 98.5 F (36.9 C) (Oral)  Resp 18  Ht  (1.549 m)  Wt 93 lb 11.2 oz (42.502 kg)  BMI 17.71 kg/m2  SpO2 100%  LMP 08/07/2014 Physical Exam  HENT:  Head: Atraumatic.  Eyes: Pupils are equal, round, and reactive to light.  Patient will only look up and then will only see  Neck: Neck supple.  Cardiovascular: Normal rate.   Pulmonary/Chest: Effort normal and breath sounds normal.  Abdominal: Soft. There is no tenderness.  Musculoskeletal: She exhibits no tenderness.  Neurological: She is alert.  Patient initially just looking up and fluttering her eyes. Face symmetric. Will not follow commands on each side but will hold arms up or into position by placement. She will then lower down. She'll not lower than on her face.  Later will stare ahead and will follow commands but will not speak. Seen by neurology in she's will not talk but will write things out on her phone.  Skin: Skin is warm.    ED Course  Procedures (including critical care time) Labs Review Labs Reviewed  URINE RAPID DRUG SCREEN, HOSP PERFORMED - Abnormal; Notable for the following:    Benzodiazepines POSITIVE (*)    Tetrahydrocannabinol POSITIVE (*)    All other components within normal limits  PREGNANCY, URINE  CBC  COMPREHENSIVE METABOLIC PANEL    Imaging Review Mr Brain Wo Contrast  08/16/2014   CLINICAL DATA:  [age] year old female with seizure-like episodes. Initial encounter.  EXAM: MRI HEAD WITHOUT CONTRAST  TECHNIQUE: Multiplanar, multiecho pulse sequences of the brain and surrounding structures were obtained without intravenous contrast.  COMPARISON:  Head CT without contrast 04/05/2008.  FINDINGS: Cerebral volume is within normal limits. Cavum septum callosum, normal anatomic variant. No ventriculomegaly. No restricted diffusion to suggest acute infarction. No midline shift, mass effect, evidence of mass lesion, extra-axial collection or acute intracranial hemorrhage. Cervicomedullary junction and pituitary are within normal limits. Major intracranial vascular flow voids are within normal limits. Negative visualized cervical spine.  Thin slice coronal T2 imaging of the brain is mildly degraded by motion. Mesial temporal lobe structures appear symmetric and within normal limits. Wallace CullensGray and white matter signal is within normal limits throughout the brain. No encephalomalacia or chronic cerebral blood products identified.  Grossly normal visualized internal auditory structures. Visualized paranasal sinuses and mastoids are clear. Orbits soft tissues appear normal. Visualized scalp soft tissues are within normal limits. Normal bone marrow signal.  IMPRESSION: Normal non contrast MRI appearance of the brain.   Electronically Signed   By: Odessa FlemingH  Hall  M.D.   On: 08/16/2014 21:38     EKG Interpretation None      MDM   Final diagnoses:  Seizure    Patient with altered mental status and question of seizure activity. 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 Discussed with neurology saw the patient who recommended MRI and EEG. Will admit to internal medicine.    Benjiman CoreNathan Temitope Flammer, MD 08/17/14 [phone]

## 2014-08-16 NOTE — ED Notes (Signed)
Bed: AV40WA22 Expected date:  Expected time:  Means of arrival:  Comments: EMS - "seizure" **Room 22**

## 2014-08-16 NOTE — Telephone Encounter (Signed)
Patient Name: Rebecca Cervantes  DOB: [birth year]    Initial Comment Caller states she just got back from [facility] because daughter had a seizure and they didn't run any tests and only gave her meds. Daughter is not making sense when she talks back to her.    Nurse Assessment  Nurse: Annye Englisharmon, RN, Denise Date/Time (Eastern Time): 08/16/2014 5:04:24 PM  Confirm and document reason for call. If symptomatic, describe symptoms. ---Caller states she just got back from [facility] because daughter had a seizure and they didn't run any tests and only gave her meds. Daughter is not making sense when she talks back to her. Pt was seen at ER today for a seizure and the last sz she had was 6 yrs ago. States the ER MD gave her Ativan, Ibuprophen for the HA and unsure of other meds given. States her dtr can write down or text what she wants to say, but is unable to speak intelligently about what she wants to say. States she was trying to say "dressing for a salad", but she said pizza. At the hospital, she was trying to say "headache", but she said "crack in the sun." Did not do testing at the ER but speeck is very impaired.  Has the patient traveled out of the country within the last 30 days? ---Not Applicable  Does the patient require triage? ---Yes  Related visit to physician within the last 2 weeks? ---Yes  Does the PT have any chronic conditions? (i.e. diabetes, asthma, etc.) ---Yes  List chronic conditions. ---Sz disorder  Did the patient indicate they were pregnant? ---No     Guidelines    Guideline Title Affirmed Question Affirmed Notes  Neurologic Deficit Sounds like a life-threatening emergency to the triager Speech impaired as she is unable to speak what she means and is saying something totally different.   Final Disposition User   Call EMS 911 Now Carmon, RN, Angelique BlonderDenise    Comments  Caller states she will not call 911 for amb transport and [facility] car.  States the pt has an appt with Dr Alwyn RenHopper on 08/17/14 at 4pm.   Referrals  [facility] - ED   Disagree/Comply: Disagree  Disagree/Comply Reason: Disagree with instructions

## 2014-08-16 NOTE — ED Provider Notes (Signed)
CSN: [id number]     Arrival date & time 08/16/14  1302 History   First MD Initiated Contact with Patient 08/16/14 1332     Chief Complaint  Patient presents with  . Seizures     (Consider location/radiation/quality/duration/timing/severity/associated sxs/prior Treatment) HPI   18yF with seizure like activity earlier today. Boyfriend reports that they were walking when she said something was wrong and she stopped.  Began to stare blankly. Eyes fluttering. He put he hands on her and asked her what was wrong and "she went stiff as a board." He eased her to a chair. Remained stiff with neck extremely flexed. Did his for 1-2 minutes. Relaxed but remained nonverbal and then resumes same activity again for 1-2 minutes. This repeated until EMS arrived 20 minutes later. No incontinence. No oral trauma. Mother reports history of "seizure" about 5 years ago. Not on meds.   Pt with inappropriate answers to many questions:  Q: Where are we? A: [place].   Q: What year is it? A: [birth year]  Gives prompt, clearly written answers to same questions though.  Q: Do you have any allergies? A: [place].  Then answered question by spelling out [id number].   Past Medical History  Diagnosis Date  . Environmental allergies   . Chronic back pain   . Headache(784.0)     Migraines  . Refusal of blood transfusions as patient is Jehovah's Witness   . Anemia   . Chronic abdominal pain   . Seizures     last one 2 years ago  . Migraine    Past Surgical History  Procedure Laterality Date  . Dental surgery     Family History  Problem Relation Age of Onset  . Asthma Mother   . Hypertension Mother   . Other Mother     woke up with anesthesia  . Hypertension Sister   . Hypertension Maternal Grandmother    History  Substance Use Topics  . Smoking status: Never Smoker   . Smokeless tobacco: Never Used  . Alcohol Use: No   OB History    Gravida Para Term Preterm AB TAB SAB Ectopic Multiple  Living   0 0 0 0 0 0 1     Review of Systems  All systems reviewed and negative, other than as noted in HPI.   Allergies  Morphine and related  Home Medications   Prior to Admission medications   Medication Sig Start Date End Date Taking? Authorizing Provider  norethindrone (NORA-BE) 0.35 MG tablet Take 1 tablet by mouth daily.   Yes Historical Provider, MD  famotidine (PEPCID) 20 MG tablet Take 1 tablet (20 mg total) by mouth 2 (two) times daily. Patient not taking: Reported on 08/16/2014 12/28/13   Veryl Speak, FNP  hyoscyamine (LEVSIN/SL) 0.125 MG SL tablet Place 1 tablet (0.125 mg total) under the tongue every 4 (four) hours as needed. Patient not taking: Reported on 12/28/2013 08/23/13   Tatyana Kirichenko, PA-C  norethindrone (MICRONOR,CAMILA,ERRIN) 0.35 MG tablet Take 1 tablet (0.35 mg total) by mouth daily. 03/08/14   Brock Bad, MD  PRESCRIPTION MEDICATION Take 1 tablet by mouth daily. "minipill" birth control    Historical Provider, MD  sucralfate (CARAFATE) 1 GM/10ML suspension Take 10 mLs (1 g total) by mouth 4 (four) times daily -  with meals and at bedtime. Patient not taking: Reported on 12/28/2013 08/23/13   Tatyana Kirichenko, PA-C   BP 96/57 mmHg  Pulse 59  Temp(Src) 98.1 F (36.7  C) (Oral)  Resp 18  Ht 5\' 1"  (1.549 m)  Wt 94 lb (42.638 kg)  BMI 17.77 kg/m2  SpO2 100%  LMP 08/07/2014 Physical Exam  Constitutional: She appears well-developed and well-nourished. No distress.  HENT:  Head: Normocephalic and atraumatic.  Eyes: Conjunctivae are normal. Pupils are equal, round, and reactive to light. Right eye exhibits no discharge. Left eye exhibits no discharge.  Neck: Neck supple.  Cardiovascular: Normal rate, regular rhythm and normal heart sounds.  Exam reveals no gallop and no friction rub.   No murmur heard. Pulmonary/Chest: Effort normal and breath sounds normal. No respiratory distress.  Abdominal: Soft. She exhibits no distension. There  is no tenderness.  Musculoskeletal: She exhibits no edema or tenderness.  Neurological: She is alert. No cranial nerve deficit. She exhibits normal muscle tone. Coordination normal.  Speech is clear. Content sometimes not appropriate, but inconsistent as noted in HPI.   Skin: Skin is warm and dry.  Psychiatric: She has a normal mood and affect. Her behavior is normal. Thought content normal.  Nursing note and vitals reviewed.   ED Course  Procedures (including critical care time) Labs Review Labs Reviewed  CBC WITH DIFFERENTIAL/PLATELET - Abnormal; Notable for the following:    Hemoglobin 15.2 (*)    MCH 34.5 (*)    All other components within normal limits  BASIC METABOLIC PANEL - Abnormal; Notable for the following:    Potassium 3.3 (*)    All other components within normal limits    Imaging Review No results found.   EKG Interpretation   Date/Time:  Monday August 16 2014 13:12:02 EDT Ventricular Rate:  60 PR Interval:  114 QRS Duration: 94 QT Interval:  444 QTC Calculation: 444 R Axis:   84 Text Interpretation:  Sinus rhythm Atrial premature complex Borderline  short PR interval Non-specific ST-t changes No old tracing to compare  Confirmed by Dyllon Henken  MD, Zabdi Mis ([id number]) on 08/16/2014 3:37:39 PM      MDM   Final diagnoses:  Seizure-like activity  Conversion disorder    18yF with seizure like activity earlier today. Inappropriate verbal answers to some questions, but this is clearly psychogenic. Not expressive aphasia. Pt can clearly given appropriate written responses to same questions. Also able to spell out answers to questions appropriately. Texting on her phone, "Why can I not talk?" Electrolytes fine. I do not feel she needs a CT of her head. Her neuro exam is otherwise normal. With this behavior, I have serious doubts that episode prior to arrival truly was seizure.     Raeford RazorStephen Luvena Wentling, MD 08/21/14 [phone]

## 2014-08-16 NOTE — ED Notes (Signed)
Pt speaking in complete sentences now, informed of need for urine sample and she states to give her a few moments.

## 2014-08-16 NOTE — H&P (Signed)
[facility] History and Physical  Caleen JobsDestini L Hyson ZOX:[id number] DOB: [birth year] DOA: 08/16/2014  Referring physician:  PCP: Jeanine Luzalone, Gregory, FNP   Chief Complaint: Possible Seizure  HPI: Rebecca Cervantes is a 19 y.o. female presents with a possible seizure. Her finacee states that he witness her having a seizure. Patient was outside at the time. She apparently froze in place and fell into his arms. Then she began to thrash about. Patient states that she does not remember. He states that she was flailing around on the bed. She did not foam at the mouth and did not bite her tongue. She did have some gurgling sounds coiming from her though. After the flailing stops she had her eyes open but her fiance states that she was not speaking. [facility]. She states that she felt as though she was in a locked in states when this occureed. Currently she is awake and alert and is joking around with her family and does not appear to be post-ictal.   Review of Systems:  Constitutional:  No weight loss, night sweats.  HEENT:  No headaches, nasal congestion, post nasal drip,  Cardio-vascular:  No chest pain, Orthopnea, PND, swelling in lower extremities  GI:  No heartburn, indigestion, abdominal pain, nausea, vomiting Resp:  No shortness of breath with exertion or at rest. No coughing up of blood.No change in color of mucus Skin:  no rash or lesions.  GU:  no dysuria, change in color of urine, no urgency or frequency  Remainder ROS is unremarkable  Past Medical History  Diagnosis Date  . Environmental allergies   . Chronic back pain   . Headache(784.0)     Migraines  . Refusal of blood transfusions as patient is Jehovah's Witness   . Anemia   . Chronic abdominal pain   . Seizures     last one 2 years ago  . Migraine    Past Surgical History  Procedure Laterality Date  . Dental surgery     Social History:  reports that she  has never smoked. She has never used smokeless tobacco. She reports that she uses illicit drugs (Marijuana) about twice per week. She reports that she does not drink alcohol.  Allergies  Allergen Reactions  . Morphine And Related Hives    Family History  Problem Relation Age of Onset  . Asthma Mother   . Hypertension Mother   . Other Mother     woke up with anesthesia  . Hypertension Sister   . Hypertension Maternal Grandmother      Prior to Admission medications   Medication Sig Start Date End Date Taking? Authorizing Provider  famotidine (PEPCID) 20 MG tablet Take 1 tablet (20 mg total) by mouth 2 (two) times daily. Patient not taking: Reported on 08/16/2014 12/28/13   Veryl SpeakGregory D Calone, FNP  hyoscyamine (LEVSIN/SL) 0.125 MG SL tablet Place 1 tablet (0.125 mg total) under the tongue every 4 (four) hours as needed. Patient not taking: Reported on 12/28/2013 08/23/13   Tatyana Kirichenko, PA-C  norethindrone (MICRONOR,CAMILA,ERRIN) 0.35 MG tablet Take 1 tablet (0.35 mg total) by mouth daily. Patient not taking: Reported on 08/16/2014 03/08/14   Brock Badharles A Harper, MD  norethindrone (NORA-BE) 0.35 MG tablet Take 1 tablet by mouth daily.    Historical Provider, MD  sucralfate (CARAFATE) 1 GM/10ML suspension Take 10 mLs (1 g total) by mouth 4 (four) times daily -  with meals and at bedtime. Patient  not taking: Reported on 12/28/2013 08/23/13   Jaynie Crumble, PA-C   Physical Exam: Filed Vitals:   08/16/14 1915 08/16/14 1930 08/16/14 1945 08/16/14 2003  BP: 101/58 104/64 99/56 103/61  Pulse: 65 62 65 78  Temp:      TempSrc:      Resp: SpO2: 100% 100% 100% 100%    Wt Readings from Last 3 Encounters:  08/16/14 42.638 kg (94 lb)  12/28/13 40.878 kg (90 lb 1.9 oz)  11/22/13 43.545 kg (96 lb)    General:  Appears calm and comfortable Eyes: PERRL, normal lids, irises & conjunctiva ENT: grossly normal hearing, lips & tongue Neck: no LAD, masses or  thyromegaly Cardiovascular: RRR, no m/r/g. No LE edema. Respiratory: CTA bilaterally, no w/r/r. Normal respiratory effort. Abdomen: soft, ntnd Skin: no rash or induration seen on limited exam Musculoskeletal: grossly normal tone BUE/BLE Psychiatric: grossly normal mood and affect, speech fluent and appropriate Neurologic: grossly non-focal.          Labs on Admission:  Basic Metabolic Panel:  Recent Labs Lab 08/16/14 1441  NA 141  K 3.3*  CL 107  CO2 23  GLUCOSE 80  BUN 12  CREATININE 0.95  CALCIUM 9.4   Liver Function Tests: No results for input(s): AST, ALT, ALKPHOS, BILITOT, PROT, ALBUMIN in the last 168 hours. No results for input(s): LIPASE, AMYLASE in the last 168 hours. No results for input(s): AMMONIA in the last 168 hours. CBC:  Recent Labs Lab 08/16/14 1441  WBC 7.8  NEUTROABS 3.6  HGB 15.2*  HCT 42.6  MCV 96.6  PLT 206   Cardiac Enzymes: No results for input(s): CKTOTAL, CKMB, CKMBINDEX, TROPONINI in the last 168 hours.  BNP (last 3 results) No results for input(s): BNP in the last 8760 hours.  ProBNP (last 3 results) No results for input(s): PROBNP in the last 8760 hours.  CBG: No results for input(s): GLUCAP in the last 168 hours.  Radiological Exams on Admission: No results found.    Assessment/Plan Principal Problem:   Seizure Active Problems:   Hypokalemia   Chronic back pain   1. 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  -Neurology has suggested however to get an MRI and also to get and EEG and admit over night for observation -hold off on medications for this at the present time  2. Hypokalemia -will replete as needed -follow up labs in am  3. Chronic Back Pain -pain meds as needed     Code Status: Full Code (must indicate code status--if unknown or must be presumed, indicate so) DVT Prophylaxis:SCD Family  Communication: Father mother (indicate person spoken with, if applicable, with phone number if by telephone) Disposition Plan: Home(indicate anticipated LOS)  Time spent:  [facility] Pager [phone]

## 2014-08-16 NOTE — ED Notes (Signed)
Per [facility]-  Witness seizure by family. Upon arrival EMS observed posturing. Altered mental status. Attempted MPA pt became aware with person, place, time however disoriented to event. No injury to self. At present pt is postictal.

## 2014-08-16 NOTE — Progress Notes (Signed)
RT called to Trauma B by ED Charge RN for pt having seizures. Pt placed on 3L [place] for extra support. Pt spo2 prior to O2 97%, 100% post [place] placement. RT will continue to monitor.

## 2014-08-16 NOTE — Discharge Instructions (Signed)
Seizure, Adult °A seizure is abnormal electrical activity in the brain. Seizures usually last from 30 seconds to 2 minutes. There are various types of seizures. °Before a seizure, you may have a warning sensation (aura) that a seizure is about to occur. An aura may include the following symptoms:  °· Fear or anxiety. °· Nausea. °· Feeling like the room is spinning (vertigo). °· Vision changes, such as seeing flashing lights or spots. °Common symptoms during a seizure include: °· A change in attention or behavior (altered mental status). °· Convulsions with rhythmic jerking movements. °· Drooling. °· Rapid eye movements. °· Grunting. °· Loss of bladder and bowel control. °· Bitter taste in the mouth. °· Tongue biting. °After a seizure, you may feel confused and sleepy. You may also have an injury resulting from convulsions during the seizure. °HOME CARE INSTRUCTIONS  °· If you are given medicines, take them exactly as prescribed by your health care provider. °· Keep all follow-up appointments as directed by your health care provider. °· Do not swim or drive or engage in risky activity during which a seizure could cause further injury to you or others until your health care provider says it is OK. °· Get adequate rest. °· Teach friends and family what to do if you have a seizure. They should: °· Lay you on the ground to prevent a fall. °· Put a cushion under your head. °· Loosen any tight clothing around your neck. °· Turn you on your side. If vomiting occurs, this helps keep your airway clear. °· Stay with you until you recover. °· Know whether or not you need emergency care. °SEEK IMMEDIATE MEDICAL CARE IF: °· The seizure lasts longer than 5 minutes. °· The seizure is severe or you do not wake up immediately after the seizure. °· You have an altered mental status after the seizure. °· You are having more frequent or worsening seizures. °Someone should drive you to the emergency department or call local emergency  services (911 in [place] °MAKE SURE YOU: °· Understand these instructions. °· Will watch your condition. °· Will get help right away if you are not doing well or get worse. °Document Released: 01/20/2000 Document Revised: 11/12/2012 Document Reviewed: 09/03/2012 °[employer]® Patient Information ©2015 [employer]. This information is not intended to replace advice given to you by your health care provider. Make sure you discuss any questions you have with your health care provider. ° °Driving and Equipment Restrictions °Some medical problems make it dangerous to drive, ride a bike, or use machines. Some of these problems are: °· A hard blow to the head (concussion). °· Passing out (fainting). °· Twitching and shaking (seizures). °· Low blood sugar. °· Taking medicine to help you relax (sedatives). °· Taking pain medicines. °· Wearing an eye patch. °· Wearing splints. This can make it hard to use parts of your body that you need to drive safely. °HOME CARE  °· Do not drive until your doctor says it is okay. °· Do not use machines until your doctor says it is okay. °You may need a form signed by your doctor (medical release) before you can drive again. You may also need this form before you do other tasks where you need to be fully alert. °MAKE SURE YOU: °· Understand these instructions. °· Will watch your condition. °· Will get help right away if you are not doing well or get worse. °Document Released: 03/01/2004 Document Revised: 04/16/2011 Document Reviewed: 06/01/2009 °[employer]® Patient Information ©2015 [employer].   This information is not intended to replace advice given to you by your health care provider. Make sure you discuss any questions you have with your health care provider. ° °

## 2014-08-16 NOTE — Consult Note (Signed)
NEURO HOSPITALIST CONSULT NOTE   Referring physician: Dr Alvino Chapel Reason for Consult: seizures  HPI:                                                                                                                                          Rebecca Cervantes is an 19 y.o. female with a past medical history that is relevant for sporadic migraines without aura, chronic back pain, chronic abdominal pain and pseudoseizures, brought in by family for further evaluation of recurrent seizures. Family is at the bedside and informed me that patient had a generalized convulsion this morning and was taken via EMS to [facility] ED where evaluation was ED attending was most consistent with a psychogenic event and patient was discharged home.  She was brought back by family this afternoon and then she was noted to have another seizure in the waiting room described as " eye fluttering and shaking". According to family, patient's seizures today had been characterized by " speech impairment, inability to express anything verbally but she can write well and understands what we are telling her, eyes open, body thrashing and back arching off an on". These seizures reportedly last for 20 minutes off and on. No bladder or bowel impairment, no tongue biting. Patient said that before her seizures today she experienced a warning of " head swimming and when I stood the room moved around and I got off balance". Denies HA but complains of double vision only in the right eye. No focal weakness or numbness or vision impairment. Regarding her seizure history, mother indicated that she had her first seizure was she was 19 years old, had had at least 3 or 4 seizures " of violent body shakiness" before, at some point was taking Lamotrigine for couple of nmonths, but prior seizure work up by a pediatric neurologist was unrevealing and she was diagnosed with pseudoseizures. Mother said that patient was seizure was 5 or 6 years  ago. No particular stressors noted.  Patient was born full at 2 months, product of a normal pregnancy and delivery, no neonatal/perinatal complications and normal development.No epilepsy, febrile seizures, CNS infections, severe head trauma, or stroke. Father with seizures that according to family are " marihuana related". Serologies: K 3.3, white count normal, UDS pending.  Past Medical History  Diagnosis Date  . Environmental allergies   . Chronic back pain   . Headache(784.0)     Migraines  . Refusal of blood transfusions as patient is Jehovah's Witness   . Anemia   . Chronic abdominal pain   . Seizures     last one 2 years ago  . Migraine     Past Surgical History  Procedure Laterality Date  . Dental surgery      Family History  Problem Relation Age of Onset  . Asthma Mother   . Hypertension Mother   . Other Mother     woke up with anesthesia  . Hypertension Sister   . Hypertension Maternal Grandmother     Family History: no brain tumors, brain aneurysms, MS   Social History:  reports that she has never smoked. She has never used smokeless tobacco. She reports that she uses illicit drugs (Marijuana) about twice per week. She reports that she does not drink alcohol.  Allergies  Allergen Reactions  . Morphine And Related Hives    MEDICATIONS:                                                                                                                     I have reviewed the patient's current medications.   ROS:                                                                                                                                       History obtained from family, chart review and the patient  General ROS: negative for - chills, fatigue, fever, night sweats, weight gain or weight loss Psychological ROS: negative for - behavioral disorder, hallucinations, memory difficulties, mood swings or suicidal ideation Ophthalmic ROS: negative for - blurry vision,  double vision, eye pain or loss of vision ENT ROS: negative for - epistaxis, nasal discharge, oral lesions, sore throat, tinnitus or vertigo Allergy and Immunology ROS: negative for - hives or itchy/watery eyes Hematological and Lymphatic ROS: negative for - bleeding problems, bruising or swollen lymph nodes Endocrine ROS: negative for - galactorrhea, hair pattern changes, polydipsia/polyuria or temperature intolerance Respiratory ROS: negative for - cough, hemoptysis, shortness of breath or wheezing Cardiovascular ROS: negative for - chest pain, dyspnea on exertion, edema or irregular heartbeat Gastrointestinal ROS: negative for - abdominal pain, diarrhea, hematemesis, nausea/vomiting or stool incontinence Genito-Urinary ROS: negative for - dysuria, hematuria, incontinence or urinary frequency/urgency Musculoskeletal ROS: negative for - joint swelling or muscular weakness Neurological ROS: as noted in HPI Dermatological ROS: negative for rash and skin lesion changes  Physical exam: pleasant female in no apparent distress. Blood pressure 101/58, pulse 65, temperature 99.1 F (37.3 C), temperature source Rectal, resp. rate 15, last menstrual period 08/07/2014, SpO2 100 %, currently breastfeeding. Head: normocephalic. Neck: supple, no bruits, no JVD. Cardiac: no murmurs. Lungs: clear. Abdomen: soft, no tender, no mass. Extremities: no  edema. Skin: no rash  Neurologic Examination:                                                                                                      General: Mental Status: Alert, oriented, thought content appropriate.  Speech fluent without evidence of aphasia.  Able to follow 3 step commands without difficulty. Cranial Nerves: II: Discs flat bilaterally; Visual fields grossly normal, pupils equal, round, reactive to light and accommodation III,IV, VI: ptosis not present, extra-ocular motions intact bilaterally V,VII: smile symmetric, facial light touch  sensation normal bilaterally VIII: hearing normal bilaterally IX,X: uvula rises symmetrically XI: bilateral shoulder shrug XII: midline tongue extension without atrophy or fasciculations  Motor: Right : Upper extremity   5/5    Left:     Upper extremity   5/5  Lower extremity   5/5     Lower extremity   5/5 Tone and bulk:normal tone throughout; no atrophy noted Sensory: Pinprick and light touch intact throughout, bilaterally Deep Tendon Reflexes:  Right: Upper Extremity   Left: Upper extremity   biceps (C-5 to C-6) 2/4   biceps (C-5 to C-6) 2/4 tricep (C7) 2/4    triceps (C7) 2/4 Brachioradialis (C6) 2/4  Brachioradialis (C6) 2/4  Lower Extremity Lower Extremity  quadriceps (L-2 to L-4) 2/4   quadriceps (L-2 to L-4) 2/4 Achilles (S1) 2/4   Achilles (S1) 2/4  Plantars: Right: downgoing   Left: downgoing Cerebellar: normal finger-to-nose,  normal heel-to-shin test Gait:  No tested due to multiple leads.    No results found for: CHOL  Results for orders placed or performed during the hospital encounter of 08/16/14 (from the past 48 hour(s))  CBC with Differential     Status: Abnormal   Collection Time: 08/16/14  2:41 PM  Result Value Ref Range   WBC 7.8 4.0 - 10.5 K/uL   RBC 4.41 3.87 - 5.11 MIL/uL   Hemoglobin 15.2 (H) 12.0 - 15.0 g/dL   HCT 42.6 36.0 - 46.0 %   MCV 96.6 78.0 - 100.0 fL   MCH 34.5 (H) 26.0 - 34.0 pg   MCHC 35.7 30.0 - 36.0 g/dL   RDW 12.1 11.5 - 15.5 %   Platelets 206 150 - 400 K/uL   Neutrophils Relative % 45 43 - 77 %   Neutro Abs 3.6 1.7 - 7.7 K/uL   Lymphocytes Relative 45 12 - 46 %   Lymphs Abs 3.5 0.7 - 4.0 K/uL   Monocytes Relative 6 3 - 12 %   Monocytes Absolute 0.5 0.1 - 1.0 K/uL   Eosinophils Relative 3 0 - 5 %   Eosinophils Absolute 0.2 0.0 - 0.7 K/uL   Basophils Relative 1 0 - 1 %   Basophils Absolute 0.0 0.0 - 0.1 K/uL  Basic metabolic panel     Status: Abnormal   Collection Time: 08/16/14  2:41 PM  Result Value Ref Range   Sodium  141 135 - 145 mmol/L   Potassium 3.3 (L) 3.5 - 5.1 mmol/L   Chloride 107 101 - 111 mmol/L   CO2 23  22 - 32 mmol/L   Glucose, Bld 80 65 - 99 mg/dL   BUN 12 6 - 20 mg/dL   Creatinine, Ser 0.95 0.44 - 1.00 mg/dL   Calcium 9.4 8.9 - 10.3 mg/dL   GFR calc non Af Amer >60 >60 mL/min   GFR calc Af Amer >60 >60 mL/min    Comment: (NOTE) The eGFR has been calculated using the CKD EPI equation. This calculation has not been validated in all clinical situations. eGFR's persistently <60 mL/min signify possible Chronic Kidney Disease.    Anion gap 11 5 - 15    No results found.  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 Patient was initially sen at [facility] ED this morning and comes to [facility] ED with very concerned family about ongoing events. At this moment she is alert and awake, doesn't communicate verbally but can write and has intact comprehension. Will suggest admission to the hospital for observation tonight and obtaining MRI brain and EEG in the morning. Do not recommending starting anticonvulsants. Will follow up.  Dorian Pod, MD 08/16/2014, 7:32 PM  [facility]

## 2014-08-16 NOTE — ED Notes (Signed)
With assistance from GrenadaBrittany, EMT and Pacifichris, EMT, we undressed pt, placed in gown, on monitor, continuous pulse oximetry, blood pressure cuff and oxygen [place] (2L), visitors at bedside

## 2014-08-16 NOTE — ED Notes (Signed)
Attempted to call report at this time. RN on the floor not ready.

## 2014-08-17 ENCOUNTER — Ambulatory Visit (HOSPITAL_COMMUNITY): Payer: BLUE CROSS/BLUE SHIELD

## 2014-08-17 ENCOUNTER — Encounter (HOSPITAL_COMMUNITY): Payer: Self-pay | Admitting: *Deleted

## 2014-08-17 ENCOUNTER — Ambulatory Visit: Payer: Self-pay | Admitting: Internal Medicine

## 2014-08-17 DIAGNOSIS — R569 Unspecified convulsions: Secondary | ICD-10-CM | POA: Diagnosis not present

## 2014-08-17 LAB — BASIC METABOLIC PANEL
Anion gap: 10 (ref 5–15)
BUN: 12 mg/dL (ref 6–20)
CO2: 23 mmol/L (ref 22–32)
Calcium: 9.3 mg/dL (ref 8.9–10.3)
Chloride: 104 mmol/L (ref 101–111)
Creatinine, Ser: 0.97 mg/dL (ref 0.44–1.00)
GFR calc Af Amer: >60 mL/min (ref >60)
GFR calc non Af Amer: >60 mL/min (ref >60)
Glucose, Bld: 91 mg/dL (ref 65–99)
Potassium: 3.5 mmol/L (ref 3.5–5.1)
Sodium: 137 mmol/L (ref 135–145)

## 2014-08-17 LAB — CBC
HCT: 38.1 % (ref 36.0–46.0)
HEMOGLOBIN: 13.4 g/dL (ref 12.0–15.0)
MCH: 34 pg (ref 26.0–34.0)
MCHC: 35.2 g/dL (ref 30.0–36.0)
MCV: 96.7 fL (ref 78.0–100.0)
Platelets: 188 10*3/uL (ref 150–400)
RBC: 3.94 MIL/uL (ref 3.87–5.11)
RDW: 12.3 % (ref 11.5–15.5)
WBC: 7.7 10*3/uL (ref 4.0–10.5)

## 2014-08-17 LAB — COMPREHENSIVE METABOLIC PANEL
ALK PHOS: 49 U/L (ref 38–126)
ALT: 16 U/L (ref 14–54)
ANION GAP: 4 — AB (ref 5–15)
AST: 34 U/L (ref 15–41)
Albumin: 4.1 g/dL (ref 3.5–5.0)
BUN: 13 mg/dL (ref 6–20)
CO2: 23 mmol/L (ref 22–32)
CREATININE: 0.95 mg/dL (ref 0.44–1.00)
Calcium: 8.5 mg/dL — ABNORMAL LOW (ref 8.9–10.3)
Chloride: 107 mmol/L (ref 101–111)
GFR calc non Af Amer: >60 mL/min (ref >60)
Glucose, Bld: 83 mg/dL (ref 65–99)
Potassium: 5.3 mmol/L — ABNORMAL HIGH (ref 3.5–5.1)
SODIUM: 134 mmol/L — AB (ref 135–145)
Total Bilirubin: 1.2 mg/dL (ref 0.3–1.2)
Total Protein: 7 g/dL (ref 6.5–8.1)

## 2014-08-17 MED ORDER — TRAMADOL HCL 50 MG PO TABS
50.0000 mg | ORAL_TABLET | Freq: Once | ORAL | Status: AC
  Administered 2014-08-17: 50 mg via ORAL
  Filled 2014-08-17: qty 1

## 2014-08-17 MED ORDER — HYDROCODONE-ACETAMINOPHEN 5-325 MG PO TABS
1.0000 | ORAL_TABLET | Freq: Once | ORAL | Status: AC
  Administered 2014-08-17: 1 via ORAL
  Filled 2014-08-17: qty 1

## 2014-08-17 MED ORDER — ONDANSETRON HCL 4 MG/2ML IJ SOLN
4.0000 mg | Freq: Four times a day (QID) | INTRAMUSCULAR | Status: DC | PRN
  Administered 2014-08-17: 4 mg via INTRAVENOUS
  Filled 2014-08-17 (×2): qty 2

## 2014-08-17 MED ORDER — ONDANSETRON 4 MG PO TBDP
ORAL_TABLET | ORAL | Status: AC
  Administered 2014-08-17: 4 mg
  Filled 2014-08-17: qty 1

## 2014-08-17 MED ORDER — ONDANSETRON HCL 4 MG PO TABS: 4.0000 mg | ORAL_TABLET | Freq: Three times a day (TID) | ORAL | Status: DC | PRN

## 2014-08-17 NOTE — Progress Notes (Signed)
Pt unavailable to come to EEG lab for EEG at this time. Will attempt again as schedule permits.

## 2014-08-17 NOTE — Progress Notes (Signed)
A second attempt to bring the patient to the EEG lab for her EEG was made. Rebecca Cervantes with Transport called stating that when they arrived to bring the patient down she was informed the patient is being discharged at this time.

## 2014-08-17 NOTE — Progress Notes (Signed)
Subjective: Patient states she has a HA rated 4/10 with some light sensitivity but improvement with Vicodin and Zofran. She is upset about a issue that occurred this AM but will not talk about it and desires to talk to charge nurse. Reports that she is not being listened to.  No further events overnight.   Objective: Current vital signs: BP 94/62 mmHg  Pulse 92  Temp(Src) 98.3 F (36.8 C) (Oral)  Resp 18  Ht  (1.549 m)  Wt 42.502 kg (93 lb 11.2 oz)  BMI 17.71 kg/m2  SpO2 98%  LMP 08/07/2014 Vital signs in last 24 hours: Temp:  [98.1 F (36.7 C)-99.1 F (37.3 C)] 98.3 F (36.8 C) (07/12 0603) Pulse Rate:  [59-92] 92 (07/12 0603) Resp:  [11-25] 18 (07/12 0603) BP: (92-104)/(53-69) 94/62 mmHg (07/12 0603) SpO2:  [98 %-100 %] 98 % (07/12 0603) Weight:  [42.502 kg (93 lb 11.2 oz)-42.638 kg (94 lb)] 42.502 kg (93 lb 11.2 oz) (07/11 2146)  Intake/Output from previous day:   Intake/Output this shift:   Nutritional status: Diet regular Room service appropriate?: Yes; Fluid consistency:: Thin  Neurologic Exam: General: Mental Status: Alert, oriented, thought content appropriate.  Speech fluent without evidence of aphasia.  Able to follow 3 step commands without difficulty. Cranial Nerves: II:  Visual fields grossly normal, pupils equal, round, reactive to light and accommodation III,IV, VI: ptosis not present, extra-ocular motions intact bilaterally V,VII: smile symmetric, facial light touch sensation normal bilaterally VIII: hearing normal bilaterally IX,X: uvula rises symmetrically XI: bilateral shoulder shrug XII: midline tongue extension without atrophy or fasciculations  Motor: Right : Upper extremity   5/5    Left:     Upper extremity   5/5  Lower extremity   5/5     Lower extremity   5/5 Tone and bulk:normal tone throughout; no atrophy noted Sensory: Pinprick and light touch intact throughout, bilaterally Deep Tendon Reflexes:  Right: Upper Extremity   Left: Upper  extremity   biceps (C-5 to C-6) 2/4   biceps (C-5 to C-6) 2/4 tricep (C7) 2/4    triceps (C7) 2/4 Brachioradialis (C6) 2/4  Brachioradialis (C6) 2/4  Lower Extremity Lower Extremity  quadriceps (L-2 to L-4) 2/4   quadriceps (L-2 to L-4) 2/4 Achilles (S1) 2/4   Achilles (S1) 2/4  Plantars: Right: downgoing   Left: downgoing Cerebellar: normal finger-to-nose,  normal heel-to-shin test    Lab Results: Basic Metabolic Panel:  Recent Labs Lab 08/16/14 1441 08/17/14 0554 08/17/14 0900  NA 141 134* 137  K 3.3* 5.3* 3.5  CL 107 107 104  CO2 GLUCOSE 80 83 91  BUN CREATININE 0.95 0.95 0.97  CALCIUM 9.4 8.5* 9.3    Liver Function Tests:  Recent Labs Lab 08/17/14 0554  AST 34  ALT 16  ALKPHOS 49  BILITOT 1.2  PROT 7.0  ALBUMIN 4.1   No results for input(s): LIPASE, AMYLASE in the last 168 hours. No results for input(s): AMMONIA in the last 168 hours.  CBC:  Recent Labs Lab 08/16/14 1441 08/17/14 0554  WBC 7.8 7.7  NEUTROABS 3.6  --   HGB 15.2* 13.4  HCT 42.6 38.1  MCV 96.6 96.7  PLT 206 188    Cardiac Enzymes: No results for input(s): CKTOTAL, CKMB, CKMBINDEX, TROPONINI in the last 168 hours.  Lipid Panel: No results for input(s): CHOL, TRIG, HDL, CHOLHDL, VLDL, LDLCALC in the last 168 hours.  CBG: No results for input(s): GLUCAP  in the last 168 hours.  Microbiology: Results for orders placed or performed during the hospital encounter of 10/21/12  Urine culture     Status: None   Collection Time: 10/21/12 10:19 AM  Result Value Ref Range Status   Specimen Description URINE, CLEAN CATCH  Final   Special Requests NONE  Final   Culture  Setup Time   Final    10/21/2012 18:36 Performed at [facility] Count   Final    >=100,000 COLONIES/ML Performed at [facility]   Culture   Final    ESCHERICHIA COLI Performed at [facility]   Report Status 10/23/2012 FINAL  Final   Organism ID, Bacteria  ESCHERICHIA COLI  Final      Susceptibility   Escherichia coli - MIC*    AMPICILLIN 4 SENSITIVE Sensitive     CEFAZOLIN <=4 SENSITIVE Sensitive     CEFTRIAXONE <=1 SENSITIVE Sensitive     CIPROFLOXACIN <=0.25 SENSITIVE Sensitive     GENTAMICIN <=1 SENSITIVE Sensitive     LEVOFLOXACIN <=0.12 SENSITIVE Sensitive     NITROFURANTOIN <=16 SENSITIVE Sensitive     TOBRAMYCIN <=1 SENSITIVE Sensitive     TRIMETH/SULFA <=20 SENSITIVE Sensitive     PIP/TAZO <=4 SENSITIVE Sensitive     * ESCHERICHIA COLI    Coagulation Studies: No results for input(s): LABPROT, INR in the last 72 hours.  Imaging: Mr Sherrin Daisy Contrast  08/16/2014   CLINICAL DATA:  19 year old female with seizure-like episodes. Initial encounter.  EXAM: MRI HEAD WITHOUT CONTRAST  TECHNIQUE: Multiplanar, multiecho pulse sequences of the brain and surrounding structures were obtained without intravenous contrast.  COMPARISON:  Head CT without contrast 04/05/2008.  FINDINGS: Cerebral volume is within normal limits. Cavum septum callosum, normal anatomic variant. No ventriculomegaly. No restricted diffusion to suggest acute infarction. No midline shift, mass effect, evidence of mass lesion, extra-axial collection or acute intracranial hemorrhage. Cervicomedullary junction and pituitary are within normal limits. Major intracranial vascular flow voids are within normal limits. Negative visualized cervical spine.  Thin slice coronal T2 imaging of the brain is mildly degraded by motion. Mesial temporal lobe structures appear symmetric and within normal limits. Wallace Cullens and white matter signal is within normal limits throughout the brain. No encephalomalacia or chronic cerebral blood products identified.  Grossly normal visualized internal auditory structures. Visualized paranasal sinuses and mastoids are clear. Orbits soft tissues appear normal. Visualized scalp soft tissues are within normal limits. Normal bone marrow signal.  IMPRESSION: Normal non  contrast MRI appearance of the brain.   Electronically Signed   By: Odessa Fleming M.D.   On: 08/16/2014 21:38    Medications:  Scheduled: . famotidine  20 mg Oral BID  . folic acid  1 mg Oral Daily  . multivitamin with minerals  1 tablet Oral Daily  . norethindrone  1 tablet Oral Daily  . sodium chloride  3 mL Intravenous Q12H  . thiamine  100 mg Oral Daily    Felicie Morn PA-C [facility] [phone]  08/17/2014, 9:54 AM  Patient seen and examined.  Clinical course and management discussed.  Necessary edits performed.  I agree with the above.  Assessment and plan of care developed and discussed below.  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  No further events while hospitalized.  MRI of the brain personally reviewed and shows no abnormalities.  Awaiting  EEG. Currently has HA but controlled with Vicodin and Zofran.   Recommendations: 1.  Will f/u results of EEG 2.  Psych consult pending     Thana FarrLeslie Ieisha Gao, MD [facility] [phone]  08/17/2014  12:43 PM

## 2014-08-17 NOTE — Progress Notes (Signed)
Pt left AMA at 10:50 am after having complaints reported to management. PT was encouraged to stay but she continued to refuse.  Pt was educated and made aware that if when she gets home she feels uncomfortable for her to return to the hospital.  Pt alert, verbal with no noted distress. IV was removed by this nurse, dry dressing applied. Pt left with her family taking all personal belongings. MD paged to notify.

## 2014-08-17 NOTE — Discharge Summary (Signed)
Physician Discharge Summary  Rebecca Cervantes ZOX:[id number] DOB: [birth year] DOA: 08/16/2014  PCP: Jeanine Luz, FNP  Admit date: 08/16/2014 Discharge date: 08/17/2014  Time spent: 15  minutes  Recommendations for Outpatient Follow-up:  Left AMA.   Discharge Diagnoses:    Hypokalemia   Chronic back pain   Filed Weights   08/16/14 2146  Weight: 42.502 kg (93 lb 11.2 oz)    History of present illness:  Rebecca Cervantes is a 19 y.o. female presents with a possible seizure. Her finacee states that he witness her having a seizure. Patient was outside at the time. She apparently froze in place and fell into his arms. Then she began to thrash about. Patient states that she does not remember. He states that she was flailing around on the bed. She did not foam at the mouth and did not bite her tongue. She did have some gurgling sounds coiming from her though. After the flailing stops she had her eyes open but her fiance states that she was not speaking. She went to [facility] was given fluids and released. She states that she felt as though she was in a locked in states when this occureed. Currently she is awake and alert and is joking around with her family and does not appear to be post-ictal.  Hospital Course:  Patient was admitted on 7-11 for work up of possible seizure. Neurology was consulted. MRI brain was negative. Patient decline EEG. She left against medical advised. I was not able to see patient, she left hospital.   Procedures:  EEG; patient refuse test  Consultations:  neurology  Discharge Exam: Filed Vitals:   08/17/14 1009  BP: 109/60  Pulse: 60  Temp: 98.4 F (36.9 C)  Resp: 18      Discharge Medication List as of 08/17/2014 10:44 AM    CONTINUE these medications which have NOT CHANGED   Details  norethindrone (NORA-BE) 0.35 MG tablet Take 1 tablet by mouth daily., Until Discontinued, Historical Med      STOP taking these medications      famotidine (PEPCID) 20 MG tablet        Allergies  Allergen Reactions  . Morphine And Related Hives      The results of significant diagnostics from this hospitalization (including imaging, microbiology, ancillary and laboratory) are listed below for reference.    Significant Diagnostic Studies: Mr Sherrin Daisy Contrast  08/16/2014   CLINICAL DATA:  19 year old female with seizure-like episodes. Initial encounter.  EXAM: MRI HEAD WITHOUT CONTRAST  TECHNIQUE: Multiplanar, multiecho pulse sequences of the brain and surrounding structures were obtained without intravenous contrast.  COMPARISON:  Head CT without contrast 04/05/2008.  FINDINGS: Cerebral volume is within normal limits. Cavum septum callosum, normal anatomic variant. No ventriculomegaly. No restricted diffusion to suggest acute infarction. No midline shift, mass effect, evidence of mass lesion, extra-axial collection or acute intracranial hemorrhage. Cervicomedullary junction and pituitary are within normal limits. Major intracranial vascular flow voids are within normal limits. Negative visualized cervical spine.  Thin slice coronal T2 imaging of the brain is mildly degraded by motion. Mesial temporal lobe structures appear symmetric and within normal limits. Wallace Cullens and white matter signal is within normal limits throughout the brain. No encephalomalacia or chronic cerebral blood products identified.  Grossly normal visualized internal auditory structures. Visualized paranasal sinuses and mastoids are clear. Orbits soft tissues appear normal. Visualized scalp soft tissues are within normal limits. Normal bone marrow signal.  IMPRESSION: Normal non contrast MRI appearance  of the brain.   Electronically Signed   By: Odessa FlemingH  Hall M.D.   On: 08/16/2014 21:38    Microbiology: No results found for this or any previous visit (from the past 240 hour(s)).   Labs: Basic Metabolic Panel:  Recent Labs Lab 08/16/14 1441 08/17/14 0554 08/17/14 0900   NA 141 134* 137  K 3.3* 5.3* 3.5  CL 107 107 104  CO2 23 23 23   GLUCOSE 80 83 91  BUN 12 13 12   CREATININE 0.95 0.95 0.97  CALCIUM 9.4 8.5* 9.3   Liver Function Tests:  Recent Labs Lab 08/17/14 0554  AST 34  ALT 16  ALKPHOS 49  BILITOT 1.2  PROT 7.0  ALBUMIN 4.1   No results for input(s): LIPASE, AMYLASE in the last 168 hours. No results for input(s): AMMONIA in the last 168 hours. CBC:  Recent Labs Lab 08/16/14 1441 08/17/14 0554  WBC 7.8 7.7  NEUTROABS 3.6  --   HGB 15.2* 13.4  HCT 42.6 38.1  MCV 96.6 96.7  PLT 206 188   Cardiac Enzymes: No results for input(s): CKTOTAL, CKMB, CKMBINDEX, TROPONINI in the last 168 hours. BNP: BNP (last 3 results) No results for input(s): BNP in the last 8760 hours.  ProBNP (last 3 results) No results for input(s): PROBNP in the last 8760 hours.  CBG: No results for input(s): GLUCAP in the last 168 hours.     SignedHartley Barefoot:  Maisee Vollman A  [facility] 08/17/2014, 4:34 PM

## 2014-08-17 NOTE — Progress Notes (Signed)
Patient arrived to [facility] at 2145. Patient is A/O and in no acute distress. Speech is clear, no seizure activity noted. Patient oriented to room and equipment. All safety measures in place. Will monitor closely overnight.

## 2014-08-17 NOTE — Progress Notes (Signed)
RN flushed patient's IV. Patient stated that normal saline flush stung IV site. RN stopped saline flush, did not go further with administering zofran for patient's nausea. Patient began cussing at nurse, flung arms out when RN was taking off saline flush from port. RN stated to patient that patient did not need to curse at RN to communicate. RN then paged MD to request PO zofran. Patient made aware. MD put in new orders for zofran PO. Patient made aware. RN went to room to administer PO zofran, patient refused. Patient requested new RN. RN made charge RN aware, charge RN reassigned patient. Report given to new RN.
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
## 2014-08-19 ENCOUNTER — Ambulatory Visit: Payer: Self-pay | Admitting: Family

## 2014-08-23 ENCOUNTER — Other Ambulatory Visit (INDEPENDENT_AMBULATORY_CARE_PROVIDER_SITE_OTHER): Payer: BLUE CROSS/BLUE SHIELD

## 2014-08-23 ENCOUNTER — Telehealth: Payer: Self-pay | Admitting: Family

## 2014-08-23 ENCOUNTER — Ambulatory Visit (INDEPENDENT_AMBULATORY_CARE_PROVIDER_SITE_OTHER): Payer: BLUE CROSS/BLUE SHIELD | Admitting: Family

## 2014-08-23 ENCOUNTER — Encounter: Payer: Self-pay | Admitting: Family

## 2014-08-23 VITALS — BP 102/68 | HR 89 | Temp 98.3°F | Resp 18 | Ht 61.0 in | Wt 93.0 lb

## 2014-08-23 DIAGNOSIS — R569 Unspecified convulsions: Secondary | ICD-10-CM | POA: Diagnosis not present

## 2014-08-23 DIAGNOSIS — N63 Unspecified lump in unspecified breast: Secondary | ICD-10-CM | POA: Insufficient documentation

## 2014-08-23 LAB — BASIC METABOLIC PANEL
BUN: 15 mg/dL (ref 6–23)
CALCIUM: 9.3 mg/dL (ref 8.4–10.5)
CHLORIDE: 104 meq/L (ref 96–112)
CO2: 28 meq/L (ref 19–32)
Creatinine, Ser: 0.91 mg/dL (ref 0.40–1.20)
GFR: 100.59 mL/min (ref >60.00)
GLUCOSE: 68 mg/dL — AB (ref 70–99)
Potassium: 4 mEq/L (ref 3.5–5.1)
Sodium: 138 mEq/L (ref 135–145)

## 2014-08-23 NOTE — Telephone Encounter (Signed)
Please inform patient that he blood work shows that her kidney function and electrolytes are within the normal ranges.

## 2014-08-23 NOTE — Assessment & Plan Note (Signed)
Questionable seizure based on hospital records and patient report. No EEG was performed secondary to patient leaving the hospital against medical advice. Denies any recurrent episodes or side effects. Obtain BMET to rule out electrolyte imbalance. Continue to monitor at this time. If another seizure occurs will refer to neurology for EEG and further work up.

## 2014-08-23 NOTE — Patient Instructions (Signed)
Thank you for choosing [facility].  Summary/Instructions:  Referrals have been made during this visit. You should expect to hear back from our schedulers in about 7-10 days in regards to establishing an appointment with the specialists we discussed.   If your symptoms worsen or fail to improve, please contact our office for further instruction, or in case of emergency go directly to the emergency room at the closest medical facility.   Continue to monitor the lump in your breast.  Continue to monitor for seizures.

## 2014-08-23 NOTE — Progress Notes (Signed)
Pre visit review using our clinic review tool, if applicable. No additional management support is needed unless otherwise documented below in the visit note. 
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
## 2014-08-23 NOTE — Assessment & Plan Note (Signed)
Small lump/cyst noted in the right upper quadrant of breast with low concern for malignancy. Obtain ultrasound for further evaluation. Continue to monitor at this time.

## 2014-08-24 ENCOUNTER — Other Ambulatory Visit: Payer: Self-pay | Admitting: Family

## 2014-08-24 DIAGNOSIS — N63 Unspecified lump in unspecified breast: Secondary | ICD-10-CM

## 2014-08-25 NOTE — Telephone Encounter (Signed)
Pt aware of lab results 

## 2014-09-14 ENCOUNTER — Ambulatory Visit
Admission: RE | Admit: 2014-09-14 | Discharge: 2014-09-14 | Disposition: A | Discharge status: Home / Self Care | Payer: BLUE CROSS/BLUE SHIELD | Source: Ambulatory Visit | Attending: Family | Admitting: Family

## 2014-09-14 DIAGNOSIS — N63 Unspecified lump in unspecified breast: Secondary | ICD-10-CM

## 2014-11-17 ENCOUNTER — Ambulatory Visit: Payer: BLUE CROSS/BLUE SHIELD | Admitting: Family

## 2014-11-17 DIAGNOSIS — Z0289 Encounter for other administrative examinations: Secondary | ICD-10-CM

## 2014-12-07 ENCOUNTER — Ambulatory Visit: Payer: BLUE CROSS/BLUE SHIELD | Admitting: Family

## 2014-12-07 DIAGNOSIS — Z0289 Encounter for other administrative examinations: Secondary | ICD-10-CM

## 2014-12-15 ENCOUNTER — Ambulatory Visit (INDEPENDENT_AMBULATORY_CARE_PROVIDER_SITE_OTHER): Payer: BLUE CROSS/BLUE SHIELD | Admitting: Family

## 2014-12-15 ENCOUNTER — Encounter: Payer: Self-pay | Admitting: Family

## 2014-12-15 ENCOUNTER — Other Ambulatory Visit: Payer: BLUE CROSS/BLUE SHIELD

## 2014-12-15 VITALS — BP 116/78 | HR 93 | Temp 98.1°F | Resp 18 | Ht 61.0 in | Wt 94.0 lb

## 2014-12-15 DIAGNOSIS — R569 Unspecified convulsions: Secondary | ICD-10-CM | POA: Diagnosis not present

## 2014-12-15 DIAGNOSIS — Z01419 Encounter for gynecological examination (general) (routine) without abnormal findings: Secondary | ICD-10-CM

## 2014-12-15 DIAGNOSIS — Z681 Body mass index (BMI) 19 or less, adult: Secondary | ICD-10-CM | POA: Diagnosis not present

## 2014-12-15 DIAGNOSIS — Z0000 Encounter for general adult medical examination without abnormal findings: Secondary | ICD-10-CM

## 2014-12-15 DIAGNOSIS — Z113 Encounter for screening for infections with a predominantly sexual mode of transmission: Secondary | ICD-10-CM | POA: Diagnosis not present

## 2014-12-15 DIAGNOSIS — R059 Cough, unspecified: Secondary | ICD-10-CM | POA: Insufficient documentation

## 2014-12-15 DIAGNOSIS — R05 Cough: Secondary | ICD-10-CM | POA: Insufficient documentation

## 2014-12-15 MED ORDER — PROMETHAZINE-DM 6.25-15 MG/5ML PO SYRP
5.0000 mL | ORAL_SOLUTION | Freq: Four times a day (QID) | ORAL | Status: DC | PRN
Start: 2014-12-15 — End: 2015-08-27

## 2014-12-15 NOTE — Progress Notes (Signed)
Pre visit review using our clinic review tool, if applicable. No additional management support is needed unless otherwise documented below in the visit note. 

## 2014-12-15 NOTE — Assessment & Plan Note (Signed)
Symptoms and exam consistent with sinusitis which is most likely viral given improvement. Start promethazine-DM as needed for cough and sleep. Continue over the counter medications as needed for symptom relief and supportive care. Follow up if symptoms worsen or fail to improve.

## 2014-12-15 NOTE — Patient Instructions (Signed)
Thank you for choosing [facility].  Summary/Instructions:  Your prescription(s) have been submitted to your pharmacy or been printed and provided for you. Please take as directed and contact our office if you believe you are having problem(s) with the medication(s) or have any questions.   Please stop by the lab on the basement level of the building for your blood work. Your results will be released to MyChart (or called to you) after review, usually within 72 hours after test completion. If any changes need to be made, you will be notified at that same time.  If your symptoms worsen or fail to improve, please contact our office for further instruction, or in case of emergency go directly to the emergency room at the closest medical facility.    General Recommendations:    Please drink plenty of fluids.  Get plenty of rest   Sleep in humidified air  Use saline nasal sprays  Netti pot   OTC Medications:  Decongestants - helps relieve congestion   Flonase (generic fluticasone) or Nasacort (generic triamcinolone) - please make sure to use the "cross-over" technique at a 45 degree angle towards the opposite eye as opposed to straight up the nasal passageway.   Sudafed (generic pseudoephedrine - Note this is the one that is available behind the pharmacy counter); Products with phenylephrine (-PE) may also be used but is often not as effective as pseudoephedrine.   If you have HIGH BLOOD PRESSURE - Coricidin HBP; AVOID any product that is -D as this contains pseudoephedrine which may increase your blood pressure.  Afrin (oxymetazoline) every 6-8 hours for up to 3 days.   Allergies - helps relieve runny nose, itchy eyes and sneezing   Claritin (generic loratidine), Allegra (fexofenidine), or Zyrtec (generic cyrterizine) for runny nose. These medications should not cause drowsiness.  Note - Benadryl (generic diphenhydramine) may be used however may cause drowsiness  Cough  -   Delsym or Robitussin (generic dextromethorphan)  Expectorants - helps loosen mucus to ease removal   Mucinex (generic guaifenesin) as directed on the package.  Headaches / General Aches   Tylenol (generic acetaminophen) - DO NOT EXCEED 3 grams (3,000 mg) in a 24 hour time period  Advil/Motrin (generic ibuprofen)   Sore Throat -   Salt water gargle   Chloraseptic (generic benzocaine) spray or lozenges / Sucrets (generic dyclonine)    Sinusitis Sinusitis is redness, soreness, and inflammation of the paranasal sinuses. Paranasal sinuses are air pockets within the bones of your face (beneath the eyes, the middle of the forehead, or above the eyes). In healthy paranasal sinuses, mucus is able to drain out, and air is able to circulate through them by way of your nose. However, when your paranasal sinuses are inflamed, mucus and air can become trapped. This can allow bacteria and other germs to grow and cause infection. Sinusitis can develop quickly and last only a short time (acute) or continue over a long period (chronic). Sinusitis that lasts for more than 12 weeks is considered chronic.  CAUSES  Causes of sinusitis include:  Allergies.  Structural abnormalities, such as displacement of the cartilage that separates your nostrils (deviated septum), which can decrease the air flow through your nose and sinuses and affect sinus drainage.  Functional abnormalities, such as when the small hairs (cilia) that line your sinuses and help remove mucus do not work properly or are not present. SIGNS AND SYMPTOMS  Symptoms of acute and chronic sinusitis are the same. The primary symptoms   are pain and pressure around the affected sinuses. Other symptoms include:  Upper toothache.  Earache.  Headache.  Bad breath.  Decreased sense of smell and taste.  A cough, which worsens when you are lying flat.  Fatigue.  Fever.  Thick drainage from your nose, which often is green and may  contain pus (purulent).  Swelling and warmth over the affected sinuses. DIAGNOSIS  Your health care provider will perform a physical exam. During the exam, your health care provider may:  Look in your nose for signs of abnormal growths in your nostrils (nasal polyps).  Tap over the affected sinus to check for signs of infection.  View the inside of your sinuses (endoscopy) using an imaging device that has a light attached (endoscope). If your health care provider suspects that you have chronic sinusitis, one or more of the following tests may be recommended:  Allergy tests.  Nasal culture. A sample of mucus is taken from your nose, sent to a lab, and screened for bacteria.  Nasal cytology. A sample of mucus is taken from your nose and examined by your health care provider to determine if your sinusitis is related to an allergy. TREATMENT  Most cases of acute sinusitis are related to a viral infection and will resolve on their own within 10 days. Sometimes medicines are prescribed to help relieve symptoms (pain medicine, decongestants, nasal steroid sprays, or saline sprays).  However, for sinusitis related to a bacterial infection, your health care provider will prescribe antibiotic medicines. These are medicines that will help kill the bacteria causing the infection.  Rarely, sinusitis is caused by a fungal infection. In theses cases, your health care provider will prescribe antifungal medicine. For some cases of chronic sinusitis, surgery is needed. Generally, these are cases in which sinusitis recurs more than 3 times per year, despite other treatments. HOME CARE INSTRUCTIONS   Drink plenty of water. Water helps thin the mucus so your sinuses can drain more easily.  Use a humidifier.  Inhale steam 3 to 4 times a day (for example, sit in the bathroom with the shower running).  Apply a warm, moist washcloth to your face 3 to 4 times a day, or as directed by your health care  provider.  Use saline nasal sprays to help moisten and clean your sinuses.  Take medicines only as directed by your health care provider.  If you were prescribed either an antibiotic or antifungal medicine, finish it all even if you start to feel better. SEEK IMMEDIATE MEDICAL CARE IF:  You have increasing pain or severe headaches.  You have nausea, vomiting, or drowsiness.  You have swelling around your face.  You have vision problems.  You have a stiff neck.  You have difficulty breathing. MAKE SURE YOU:   Understand these instructions.  Will watch your condition.  Will get help right away if you are not doing well or get worse. Document Released: 01/22/2005 Document Revised: 06/08/2013 Document Reviewed: 02/06/2011 Exit[employer] Patient Information 2015 [employer]. This information is not intended to replace advice given to you by your health care provider. Make sure you discuss any questions you have with your health care provider.   

## 2014-12-15 NOTE — Assessment & Plan Note (Signed)
Low BMI. Indicates that she is eating well and reports some new weight gain. Discussed importance of adequate nutrition. Continue to monitor.

## 2014-12-15 NOTE — Progress Notes (Signed)
Subjective:    Patient ID: Rebecca Cervantes, female    DOB: 1995/03/07, 19 y.o.   MRN: [id number]  Chief Complaint  Patient presents with  . Nasal Congestion    STD testing, also chills, body aches, congestion, sore throat, sinus headache    HPI:  Rebecca Cervantes is a 19 y.o. female who  has a past medical history of Environmental allergies; Chronic back pain; Headache(784.0); Refusal of blood transfusions as patient is Jehovah's Witness; Anemia; Chronic abdominal pain; Seizures (HCC); and Migraine. and presents today for an office visit.  1.) Sinus issues - Associated symptoms of sore throat, congestion, and sinus pressure has been going on for about 2 days. Reports that her symptoms have improved slightly today compared to yesterday. Modifying factors include Nyquil and Dayquil which have provided some symptom relief. Timing of the cough is worst at night. Denies recent antibiotic use.    2.) At risk sexual behavior - Currently sexually active and has not been tested within the last 2 years. She does use protection and denies having multiple partners. Denies any associated symptoms of vaginal discharge, abdominal pain or rashes/infection.    Allergies  Allergen Reactions  . Morphine And Related Hives     Current Outpatient Prescriptions on File Prior to Visit  Medication Sig Dispense Refill  . norethindrone (NORA-BE) 0.35 MG tablet Take 1 tablet by mouth daily.     No current facility-administered medications on file prior to visit.    Past Medical History  Diagnosis Date  . Environmental allergies   . Chronic back pain   . Headache(784.0)     Migraines  . Refusal of blood transfusions as patient is Jehovah's Witness   . Anemia   . Chronic abdominal pain   . Seizures (HCC)     last one 2 years ago  . Migraine     Review of Systems  Constitutional: Positive for chills. Negative for fever.  HENT: Positive for congestion and sore throat.   Genitourinary:  Negative for frequency, hematuria, flank pain, vaginal discharge, genital sores, vaginal pain and dyspareunia.      Objective:    BP 116/78 mmHg  Pulse 93  Temp(Src) 98.1 F (36.7 C) (Oral)  Resp 18  Ht  (1.549 m)  Wt 94 lb (42.638 kg)  BMI 17.77 kg/m2  SpO2 98% Nursing note and vital signs reviewed.  Physical Exam  Constitutional: She is oriented to person, place, and time. She appears well-developed and well-nourished. No distress.  HENT:  Right Ear: Hearing, tympanic membrane, external ear and ear canal normal.  Left Ear: Hearing, tympanic membrane, external ear and ear canal normal.  Nose: Right sinus exhibits maxillary sinus tenderness and frontal sinus tenderness. Left sinus exhibits maxillary sinus tenderness and frontal sinus tenderness.  Mouth/Throat: Uvula is midline, oropharynx is clear and moist and mucous membranes are normal.  Neck: Neck supple.  Cardiovascular: Normal rate, regular rhythm, normal heart sounds and intact distal pulses.   Pulmonary/Chest: Effort normal and breath sounds normal.  Abdominal: Soft. Bowel sounds are normal. She exhibits no distension and no mass. There is no tenderness. There is no rebound and no guarding.  Lymphadenopathy:    She has no cervical adenopathy.  Neurological: She is alert and oriented to person, place, and time.  Skin: Skin is warm and dry.  Psychiatric: She has a normal mood and affect. Her behavior is normal. Judgment and thought content normal.       Assessment & Plan:  Problem List Items Addressed This Visit      Other   Seizure (HCC)    Stable with no current seizures and not maintained on medications.       Routine screening for STI (sexually transmitted infection)    Requesting testing for STI with no current symptoms. Does report using protection and only having 1 partner with no symptoms. Obtain RPR, HSV 1 & 2, HIV, and GC/chlamydia probe. Follow up pending results.       Relevant Orders    GC/chlamydia probe amp, urine   RPR   HSV 2 antibody, IgG   HSV 1 antibody, IgG   HIV antibody   Cough - Primary    Symptoms and exam consistent with sinusitis which is most likely viral given improvement. Start promethazine-DM as needed for cough and sleep. Continue over the counter medications as needed for symptom relief and supportive care. Follow up if symptoms worsen or fail to improve.       Relevant Medications   promethazine-dextromethorphan (PROMETHAZINE-DM) 6.25-15 MG/5ML syrup

## 2014-12-15 NOTE — Assessment & Plan Note (Signed)
Stable with no current seizures and not maintained on medications.

## 2014-12-15 NOTE — Assessment & Plan Note (Signed)
Requesting testing for STI with no current symptoms. Does report using protection and only having 1 partner with no symptoms. Obtain RPR, HSV 1 & 2, HIV, and GC/chlamydia probe. Follow up pending results.

## 2014-12-16 ENCOUNTER — Telehealth: Payer: Self-pay | Admitting: Family

## 2014-12-16 LAB — HSV 1 ANTIBODY, IGG: HSV 1 Glycoprotein G Ab, IgG: <0.1 IV

## 2014-12-16 LAB — HSV 2 ANTIBODY, IGG: HSV 2 Glycoprotein G Ab, IgG: <0.1 IV

## 2014-12-16 LAB — GC/CHLAMYDIA PROBE AMP, URINE
Chlamydia, Swab/Urine, PCR: POSITIVE — AB
GC Probe Amp, Urine: NEGATIVE

## 2014-12-16 LAB — HIV ANTIBODY (ROUTINE TESTING W REFLEX): HIV 1&2 Ab, 4th Generation: NONREACTIVE

## 2014-12-16 LAB — RPR

## 2014-12-16 MED ORDER — AZITHROMYCIN 250 MG PO TABS: ORAL_TABLET | ORAL | Status: DC

## 2014-12-16 NOTE — Telephone Encounter (Signed)
Pt aware of results 

## 2014-12-16 NOTE — Telephone Encounter (Signed)
Please inform patient that she is positive for chlamydia. Therefore I have sent in a prescription for Azithromycin to treat. All of her other blood work at this time is normal and negative.

## 2015-05-06 ENCOUNTER — Telehealth: Payer: Self-pay

## 2015-05-06 NOTE — Telephone Encounter (Signed)
LVM for pt to call back as soon as possible.   RE: Flu Vaccine for this flu season.   

## 2015-08-27 ENCOUNTER — Inpatient Hospital Stay (HOSPITAL_COMMUNITY): Payer: Self-pay

## 2015-08-27 ENCOUNTER — Encounter (HOSPITAL_COMMUNITY): Payer: Self-pay

## 2015-08-27 ENCOUNTER — Telehealth: Payer: Self-pay | Admitting: Certified Nurse Midwife

## 2015-08-27 ENCOUNTER — Inpatient Hospital Stay (HOSPITAL_COMMUNITY)
Admission: AD | Admit: 2015-08-27 | Discharge: 2015-08-27 | Disposition: A | Discharge status: Home / Self Care | Payer: Self-pay | Source: Ambulatory Visit | Attending: Obstetrics and Gynecology | Admitting: Obstetrics and Gynecology

## 2015-08-27 DIAGNOSIS — Z6790 Unspecified blood type, Rh positive: Secondary | ICD-10-CM

## 2015-08-27 DIAGNOSIS — Z3491 Encounter for supervision of normal pregnancy, unspecified, first trimester: Secondary | ICD-10-CM

## 2015-08-27 DIAGNOSIS — Z3A01 Less than 8 weeks gestation of pregnancy: Secondary | ICD-10-CM | POA: Insufficient documentation

## 2015-08-27 DIAGNOSIS — O26851 Spotting complicating pregnancy, first trimester: Secondary | ICD-10-CM

## 2015-08-27 DIAGNOSIS — O418X10 Other specified disorders of amniotic fluid and membranes, first trimester, not applicable or unspecified: Secondary | ICD-10-CM

## 2015-08-27 DIAGNOSIS — O219 Vomiting of pregnancy, unspecified: Secondary | ICD-10-CM | POA: Insufficient documentation

## 2015-08-27 DIAGNOSIS — O468X1 Other antepartum hemorrhage, first trimester: Secondary | ICD-10-CM

## 2015-08-27 DIAGNOSIS — O43891 Other placental disorders, first trimester: Secondary | ICD-10-CM

## 2015-08-27 DIAGNOSIS — O208 Other hemorrhage in early pregnancy: Secondary | ICD-10-CM | POA: Insufficient documentation

## 2015-08-27 LAB — CBC WITH DIFFERENTIAL/PLATELET
BASOS ABS: 0 10*3/uL (ref 0.0–0.1)
Basophils Relative: 0 %
EOS ABS: 0.1 10*3/uL (ref 0.0–0.7)
EOS PCT: 1 %
HCT: 36.2 % (ref 36.0–46.0)
Hemoglobin: 12.8 g/dL (ref 12.0–15.0)
Lymphocytes Relative: 33 %
Lymphs Abs: 2.6 10*3/uL (ref 0.7–4.0)
MCH: 33.7 pg (ref 26.0–34.0)
MCHC: 35.4 g/dL (ref 30.0–36.0)
MCV: 95.3 fL (ref 78.0–100.0)
Monocytes Absolute: 0.2 10*3/uL (ref 0.1–1.0)
Monocytes Relative: 3 %
Neutro Abs: 4.9 10*3/uL (ref 1.7–7.7)
Neutrophils Relative %: 63 %
PLATELETS: 192 10*3/uL (ref 150–400)
RBC: 3.8 MIL/uL — AB (ref 3.87–5.11)
RDW: 12.5 % (ref 11.5–15.5)
WBC: 7.8 10*3/uL (ref 4.0–10.5)

## 2015-08-27 LAB — URINALYSIS, ROUTINE W REFLEX MICROSCOPIC
BILIRUBIN URINE: NEGATIVE
Glucose, UA: NEGATIVE mg/dL
Ketones, ur: NEGATIVE mg/dL
Leukocytes, UA: NEGATIVE
Nitrite: NEGATIVE
Protein, ur: NEGATIVE mg/dL
SPECIFIC GRAVITY, URINE: 1.01 (ref 1.005–1.030)
pH: 7 (ref 5.0–8.0)

## 2015-08-27 LAB — POCT PREGNANCY, URINE: Preg Test, Ur: POSITIVE — AB

## 2015-08-27 LAB — RPR: RPR: NONREACTIVE

## 2015-08-27 LAB — URINE MICROSCOPIC-ADD ON

## 2015-08-27 LAB — WET PREP, GENITAL
Sperm: NONE SEEN
Yeast Wet Prep HPF POC: NONE SEEN

## 2015-08-27 LAB — HCG, QUANTITATIVE, PREGNANCY: hCG, Beta Chain, Quant, S: 27381 m[IU]/mL — ABNORMAL HIGH (ref <5)

## 2015-08-27 LAB — HIV ANTIBODY (ROUTINE TESTING W REFLEX): HIV Screen 4th Generation wRfx: NONREACTIVE

## 2015-08-27 MED ORDER — METRONIDAZOLE 500 MG PO TABS: 2000.0000 mg | ORAL_TABLET | Freq: Once | ORAL | Status: DC

## 2015-08-27 NOTE — Discharge Instructions (Signed)
Morning Sickness °Morning sickness is when you feel sick to your stomach (nauseous) during pregnancy. This nauseous feeling may or may not come with vomiting. It often occurs in the morning but can be a problem any time of day. Morning sickness is most common during the first trimester, but it may continue throughout pregnancy. While morning sickness is unpleasant, it is usually harmless unless you develop severe and continual vomiting (hyperemesis gravidarum). This condition requires more intense treatment.  °CAUSES  °The cause of morning sickness is not completely known but seems to be related to normal hormonal changes that occur in pregnancy. °RISK FACTORS °You are at greater risk if you: °· Experienced nausea or vomiting before your pregnancy. °· Had morning sickness during a previous pregnancy. °· Are pregnant with more than one baby, such as twins. °TREATMENT  °Do not use any medicines (prescription, over-the-counter, or herbal) for morning sickness without first talking to your health care provider. Your health care provider may prescribe or recommend: °· Vitamin B6 supplements. °· Anti-nausea medicines. °· The herbal medicine ginger. °HOME CARE INSTRUCTIONS  °· Only take over-the-counter or prescription medicines as directed by your health care provider. °· Taking multivitamins before getting pregnant can prevent or decrease the severity of morning sickness in most women. °· Eat a piece of dry toast or unsalted crackers before getting out of bed in the morning. °· Eat five or six small meals a day. °· Eat dry and bland foods (rice, baked potato). Foods high in carbohydrates are often helpful. °· Do not drink liquids with your meals. Drink liquids between meals. °· Avoid greasy, fatty, and spicy foods. °· Get someone to cook for you if the smell of any food causes nausea and vomiting. °· If you feel nauseous after taking prenatal vitamins, take the vitamins at night or with a snack.  °· Snack on protein  foods (nuts, yogurt, cheese) between meals if you are hungry. °· Eat unsweetened gelatins for desserts. °· Wearing an acupressure wristband (worn for sea sickness) may be helpful. °· Acupuncture may be helpful. °· Do not smoke. °· Get a humidifier to keep the air in your house free of odors. °· Get plenty of fresh air. °SEEK MEDICAL CARE IF:  °· Your home remedies are not working, and you need medicine. °· You feel dizzy or lightheaded. °· You are losing weight. °SEEK IMMEDIATE MEDICAL CARE IF:  °· You have persistent and uncontrolled nausea and vomiting. °· You pass out (faint). °MAKE SURE YOU: °· Understand these instructions. °· Will watch your condition. °· Will get help right away if you are not doing well or get worse. °  °This information is not intended to replace advice given to you by your health care provider. Make sure you discuss any questions you have with your health care provider. °  °Document Released: 03/15/2006 Document Revised: 01/27/2013 Document Reviewed: 07/09/2012 °Elsevier Interactive Patient Education ©2016 [employer] ° ° ° °Subchorionic Hematoma °A subchorionic hematoma is a gathering of blood between the outer wall of the placenta and the inner wall of the womb (uterus). The placenta is the organ that connects the fetus to the wall of the uterus. The placenta performs the feeding, breathing (oxygen to the fetus), and waste removal (excretory work) of the fetus.  °Subchorionic hematoma is the most common abnormality found on a result from ultrasonography done during the first trimester or early second trimester of pregnancy. If there has been little or no vaginal bleeding, early small hematomas usually shrink   on their own and do not affect your baby or pregnancy. The blood is gradually absorbed over 1-2 weeks. When bleeding starts later in pregnancy or the hematoma is larger or occurs in an older pregnant woman, the outcome may not be as good. Larger hematomas may get bigger, which  increases the chances for miscarriage. Subchorionic hematoma also increases the risk of premature detachment of the placenta from the uterus, preterm (premature) labor, and stillbirth. °HOME CARE INSTRUCTIONS °· Stay on bed rest if your health care provider recommends this. Although bed rest will not prevent more bleeding or prevent a miscarriage, your health care provider may recommend bed rest until you are advised otherwise. °· Avoid heavy lifting (more than 10 lb [4.5 kg]), exercise, sexual intercourse, or douching as directed by your health care provider. °· Keep track of the number of pads you use each day and how soaked (saturated) they are. Write down this information. °· Do not use tampons. °· Keep all follow-up appointments as directed by your health care provider. Your health care provider may ask you to have follow-up blood tests or ultrasound tests or both. °SEEK IMMEDIATE MEDICAL CARE IF: °· You have severe cramps in your stomach, back, abdomen, or pelvis. °· You have a fever. °· You pass large clots or tissue. Save any tissue for your health care provider to look at. °· Your bleeding increases or you become lightheaded, feel weak, or have fainting episodes. °  °This information is not intended to replace advice given to you by your health care provider. Make sure you discuss any questions you have with your health care provider. °  °Document Released: 05/09/2006 Document Revised: 02/12/2014 Document Reviewed: 08/21/2012 °Elsevier Interactive Patient Education ©2016 Elsevier Inc. ° °

## 2015-08-27 NOTE — Telephone Encounter (Signed)
Pt seen in MAU for first trimester spotting. Wet prep results resulted after pt d/c'd home, +trich. Notified pt of diagnosis and STI. Will need partner treated. Abstain from IC or use condoms until TOC neg in 2-3 weeks. Pt verbalized understanding.

## 2015-08-27 NOTE — MAU Provider Note (Signed)
History     CSN: [id number]  Arrival date and time: 08/27/15 4098   First Provider Initiated Contact with Patient 08/27/15 [phone]      Chief Complaint  Patient presents with  . Vaginal Bleeding   HPI Ms. Rebecca Cervantes is a 20 y.o. G2P1001 at [redacted]w[redacted]d who presents to MAU today with complaint of spotting since this morning. The patient denies pain, vaginal discharge, recent intercourse, UTI symptoms or fever. She has had nausea, but none today. She denies diarrhea or constipation. She states LMP 07/15/15 and recent +HPT.    OB History    Gravida Para Term Preterm AB TAB SAB Ectopic Multiple Living   0 0 0 0 0 0 1      Past Medical History  Diagnosis Date  . Environmental allergies   . Chronic back pain   . Headache(784.0)     Migraines  . Refusal of blood transfusions as patient is Jehovah's Witness   . Anemia   . Chronic abdominal pain   . Seizures (HCC)     last one 2 years ago  . Migraine     Past Surgical History  Procedure Laterality Date  . Dental surgery      Family History  Problem Relation Age of Onset  . Asthma Mother   . Hypertension Mother   . Other Mother     woke up with anesthesia  . Hypertension Sister   . Hypertension Maternal Grandmother     Social History  Substance Use Topics  . Smoking status: Never Smoker   . Smokeless tobacco: Never Used  . Alcohol Use: No    Allergies:  Allergies  Allergen Reactions  . Morphine And Related Hives    Prescriptions prior to admission  Medication Sig Dispense Refill Last Dose  . azithromycin (ZITHROMAX) 250 MG tablet Take 4 tablets by mouth once. 4 tablet 0   . norethindrone (NORA-BE) 0.35 MG tablet Take 1 tablet by mouth daily.   Taking  . promethazine-dextromethorphan (PROMETHAZINE-DM) 6.25-15 MG/5ML syrup Take 5 mLs by mouth 4 (four) times daily as needed. 118 mL 0     Review of Systems  Constitutional: Negative for fever and malaise/fatigue.  Gastrointestinal: Positive for nausea.  Negative for vomiting, abdominal pain, diarrhea and constipation.  Genitourinary: Negative for dysuria, urgency and frequency.       + spotting   Physical Exam   Blood pressure 105/71, pulse 94, temperature 97.2 F (36.2 C), resp. rate 18, height  (1.549 m), weight 95 lb 6.4 oz (43.273 kg), last menstrual period 07/15/2015, currently breastfeeding.  Physical Exam  Nursing note and vitals reviewed. Constitutional: She is oriented to person, place, and time. She appears well-developed and well-nourished. No distress.  HENT:  Head: Normocephalic and atraumatic.  Cardiovascular: Normal rate.   Respiratory: Effort normal.  GI: Soft. She exhibits no distension and no mass. There is no tenderness. There is no rebound and no guarding.  Genitourinary: Uterus is enlarged (slightly). Uterus is not tender. Cervix exhibits no motion tenderness, no discharge and no friability. Right adnexum displays no mass and no tenderness. Left adnexum displays no mass and no tenderness. There is bleeding (scant light blood noted) in the vagina. No vaginal discharge found.  Cervix: closed, thick, posterior  Neurological: She is alert and oriented to person, place, and time.  Skin: Skin is warm and dry. No erythema.  Psychiatric: She has a normal mood and affect.    Results for orders placed  or performed during the hospital encounter of 08/27/15 (from the past 24 hour(s))  Pregnancy, urine POC     Status: Abnormal   Collection Time: 08/27/15  7:28 AM  Result Value Ref Range   Preg Test, Ur POSITIVE (A) NEGATIVE  CBC with Differential/Platelet     Status: Abnormal (Preliminary result)   Collection Time: 08/27/15  7:32 AM  Result Value Ref Range   WBC 7.8 4.0 - 10.5 K/uL   RBC 3.80 (L) 3.87 - 5.11 MIL/uL   Hemoglobin 12.8 12.0 - 15.0 g/dL   HCT 38.7 56.4 - 33.2 %   MCV 95.3 78.0 - 100.0 fL   MCH 33.7 26.0 - 34.0 pg   MCHC 35.4 30.0 - 36.0 g/dL   RDW 95.1 88.4 - 16.6 %   Platelets 192 150 - 400 K/uL    Neutrophils Relative % 63 %   Neutro Abs 4.9 1.7 - 7.7 K/uL   Lymphocytes Relative 33 %   Lymphs Abs 2.6 0.7 - 4.0 K/uL   Monocytes Relative 3 %   Monocytes Absolute 0.2 0.1 - 1.0 K/uL   Eosinophils Relative 1 %   Eosinophils Absolute 0.1 0.0 - 0.7 K/uL   Basophils Relative 0 %   Basophils Absolute 0.0 0.0 - 0.1 K/uL   Other PENDING %   No results found. US Ob Comp Less 14 Wks  08/27/2015  CLINICAL DATA:  First trimester vaginal bleeding. EXAM: OBSTETRIC <14 WK [place] AND TRANSVAGINAL OB US TECHNIQUE: Both transabdominal and transvaginal ultrasound examinations were performed for complete evaluation of the gestation as well as the maternal uterus, adnexal regions, and pelvic cul-de-sac. Transvaginal technique was performed to assess early pregnancy. COMPARISON:  08/10/2011 FINDINGS: Intrauterine gestational sac: Present Yolk sac:  Present Embryo:  Present Cardiac Activity: Present Heart Rate: 119  bpm CRL:  42  mm   6 w   1 d                  [place] EDC: 04/20/2016 Subchorionic hemorrhage: Minimal subchronic hemorrhage is identified inferiorly and to the left. Example image 41. Maternal uterus/adnexae: Both ovaries are within normal limits. No adnexal mass. No free fluid. IMPRESSION: 1. Intrauterine pregnancy of approximately 6 weeks 1 day with fetal heart rate of 119 beats per minute. 2. Minimal subchronic hemorrhage. Electronically Signed   By: Jeronimo Greaves M.D.   On: 08/27/2015 08:47   US Ob Transvaginal  08/27/2015  CLINICAL DATA:  First trimester vaginal bleeding. EXAM: OBSTETRIC <14 WK [place] AND TRANSVAGINAL OB US TECHNIQUE: Both transabdominal and transvaginal ultrasound examinations were performed for complete evaluation of the gestation as well as the maternal uterus, adnexal regions, and pelvic cul-de-sac. Transvaginal technique was performed to assess early pregnancy. COMPARISON:  08/10/2011 FINDINGS: Intrauterine gestational sac: Present Yolk sac:  Present Embryo:  Present Cardiac Activity: Present  Heart Rate: 119  bpm CRL:  42  mm   6 w   1 d                  [place] EDC: 04/20/2016 Subchorionic hemorrhage: Minimal subchronic hemorrhage is identified inferiorly and to the left. Example image 41. Maternal uterus/adnexae: Both ovaries are within normal limits. No adnexal mass. No free fluid. IMPRESSION: 1. Intrauterine pregnancy of approximately 6 weeks 1 day with fetal heart rate of 119 beats per minute. 2. Minimal subchronic hemorrhage. Electronically Signed   By: Jeronimo Greaves M.D.   On: 08/27/2015 08:47    MAU Course  Procedures None  MDM +UPT UA, wet prep, GC/chlamydia, CBC, quant hCG, HIV, RPR and [place] today to rule out ectopic pregnancy O+ blood type from previous visit in Epic 0800 - patient in [place]. Care turned over to [facility], CNM  Marny Lowenstein, PA-C  08/27/2015, 7:59 AM  Assessment and Plan   1. Rh(D) positive   2. Spotting affecting pregnancy in first trimester, antepartum   3. First trimester pregnancy   4. Subchorionic hematoma in first trimester   5. Nausea/vomiting in pregnancy    Discharge home OTC Diclegis regimen Start PNV 1 po daily SAB precautions Follow up in 1-2 weeks with Dr. Clearance Coots Return to MAU for worsening sx  Donette Larry, CNM 8:51 AM 08/27/15

## 2015-08-27 NOTE — MAU Note (Signed)
Went to [facility] and wiped and was between bright red and dark red in color. No pain

## 2015-08-27 NOTE — Telephone Encounter (Signed)
Unable to print Rx. Rx called into [facility] on file

## 2015-08-29 LAB — GC/CHLAMYDIA PROBE AMP (~~LOC~~) NOT AT ARMC
Chlamydia: POSITIVE — AB
Neisseria Gonorrhea: NEGATIVE

## 2015-08-30 ENCOUNTER — Encounter (HOSPITAL_COMMUNITY): Payer: Self-pay | Admitting: *Deleted

## 2015-08-30 ENCOUNTER — Telehealth (HOSPITAL_COMMUNITY): Payer: Self-pay | Admitting: *Deleted

## 2015-08-30 DIAGNOSIS — A749 Chlamydial infection, unspecified: Secondary | ICD-10-CM

## 2015-08-30 DIAGNOSIS — O98811 Other maternal infectious and parasitic diseases complicating pregnancy, first trimester: Principal | ICD-10-CM

## 2015-08-30 MED ORDER — AZITHROMYCIN 500 MG PO TABS: ORAL_TABLET | ORAL | 0 refills | Status: DC

## 2015-08-30 NOTE — Telephone Encounter (Signed)
Patient returned call, notified her of positive chlamydia.  Rx routed to pharmacy per protocol.  Instructed patient to notify her partner for treatment and to abstain from sex for seven days post treatment.  Report faxed to health department.

## 2015-09-07 ENCOUNTER — Telehealth: Payer: Self-pay | Admitting: Emergency Medicine

## 2015-09-07 NOTE — Telephone Encounter (Signed)
Pts mother called and pt is [redacted] wks pregnant and having a lot of nausea/vomitting. She was seen in the ER and was told to follow up with her OB. Since she is only [redacted] wks pregnant her OB stated they wouldn't see her. She want to know if she can get something for nausea/vomitting. Pharmacy is [facility]. Please follow up thanks.

## 2015-09-07 NOTE — Telephone Encounter (Signed)
Please advise 

## 2015-09-08 MED ORDER — DOXYLAMINE-PYRIDOXINE 10-10 MG PO TBEC: DELAYED_RELEASE_TABLET | ORAL | 1 refills | Status: DC

## 2015-09-08 NOTE — Telephone Encounter (Signed)
Diclegis sent to pharmacy.  

## 2015-09-08 NOTE — Telephone Encounter (Signed)
Patient called back about wanting something for nausea. Please advise.

## 2015-09-08 NOTE — Telephone Encounter (Signed)
Called patient and informed of the rx is at pharmacy.

## 2015-09-20 ENCOUNTER — Inpatient Hospital Stay (HOSPITAL_COMMUNITY)
Admission: AD | Admit: 2015-09-20 | Discharge: 2015-09-20 | Disposition: A | Discharge status: Home / Self Care | Payer: 59 | Source: Ambulatory Visit | Attending: Family Medicine | Admitting: Family Medicine

## 2015-09-20 ENCOUNTER — Encounter (HOSPITAL_COMMUNITY): Payer: Self-pay

## 2015-09-20 DIAGNOSIS — O99611 Diseases of the digestive system complicating pregnancy, first trimester: Secondary | ICD-10-CM | POA: Insufficient documentation

## 2015-09-20 DIAGNOSIS — O219 Vomiting of pregnancy, unspecified: Secondary | ICD-10-CM | POA: Diagnosis not present

## 2015-09-20 DIAGNOSIS — K219 Gastro-esophageal reflux disease without esophagitis: Secondary | ICD-10-CM | POA: Diagnosis not present

## 2015-09-20 DIAGNOSIS — Z3A09 9 weeks gestation of pregnancy: Secondary | ICD-10-CM | POA: Diagnosis not present

## 2015-09-20 LAB — URINALYSIS, ROUTINE W REFLEX MICROSCOPIC
BILIRUBIN URINE: NEGATIVE
Glucose, UA: NEGATIVE mg/dL
Hgb urine dipstick: NEGATIVE
KETONES UR: NEGATIVE mg/dL
Leukocytes, UA: NEGATIVE
NITRITE: NEGATIVE
Protein, ur: NEGATIVE mg/dL
Specific Gravity, Urine: 1.01 (ref 1.005–1.030)
pH: 7 (ref 5.0–8.0)

## 2015-09-20 MED ORDER — FAMOTIDINE 20 MG PO TABS
20.0000 mg | ORAL_TABLET | Freq: Once | ORAL | Status: AC
  Administered 2015-09-20: 20 mg via ORAL
  Filled 2015-09-20 (×2): qty 1

## 2015-09-20 MED ORDER — FAMOTIDINE 40 MG PO TABS: 40.0000 mg | ORAL_TABLET | Freq: Every day | ORAL | 1 refills | Status: DC

## 2015-09-20 MED ORDER — PROMETHAZINE HCL 25 MG PO TABS
25.0000 mg | ORAL_TABLET | Freq: Once | ORAL | Status: AC
  Administered 2015-09-20: 25 mg via ORAL
  Filled 2015-09-20: qty 1

## 2015-09-20 MED ORDER — PROMETHAZINE HCL 25 MG PO TABS: 12.5000 mg | ORAL_TABLET | Freq: Four times a day (QID) | ORAL | 1 refills | Status: DC | PRN

## 2015-09-20 NOTE — MAU Provider Note (Signed)
History     CSN: [id number]  Arrival date and time: 09/20/15 [id number]   First Provider Initiated Contact with Patient 09/20/15 2006      Chief Complaint  Patient presents with  . Emesis During Pregnancy   Rebecca Cervantes is a 20 y.o. G2P1001 at 4839w4d who presents today with nausea and vomiting during pregnancy. She has been taking diclegis, but it is not helping. She has no other medications at home. She states that today she had one episode with blood in the vomit.    Emesis   This is a new problem. The current episode started 1 to 4 weeks ago. The problem occurs more than 10 times per day. The problem has been unchanged. The emesis has an appearance of stomach contents and bright red blood. There has been no fever. Pertinent negatives include no abdominal pain, chills, diarrhea or fever. Risk factors: pregnancy  Treatments tried: diclegis  The treatment provided no relief.     Past Medical History:  Diagnosis Date  . Anemia   . Chronic abdominal pain   . Chronic back pain   . Environmental allergies   . Headache(784.0)    Migraines  . Migraine   . Refusal of blood transfusions as patient is Jehovah's Witness   . Seizures (HCC)    last one 2 years ago    Past Surgical History:  Procedure Laterality Date  . DENTAL SURGERY      Family History  Problem Relation Age of Onset  . Asthma Mother   . Hypertension Mother   . Other Mother     woke up with anesthesia  . Hypertension Sister   . Hypertension Maternal Grandmother     Social History  Substance Use Topics  . Smoking status: Never Smoker  . Smokeless tobacco: Never Used  . Alcohol use No    Allergies:  Allergies  Allergen Reactions  . Morphine And Related Hives    Prescriptions Prior to Admission  Medication Sig Dispense Refill Last Dose  . azithromycin (ZITHROMAX) 500 MG tablet Take two tablets by mouth once 2 tablet 0   . Doxylamine-Pyridoxine (DICLEGIS) 10-10 MG TBEC Two tablets by mouth at bedtime  on day 1 and 2; if symptoms persist, take 1 tablet in morning and 2 tablets at bedtime on day 3; if symptoms persist, may increase to 1 tablet in morning, 1 tablet mid-afternoon, and 2 tablets at bedtime on day 4 60 tablet 1   . metroNIDAZOLE (FLAGYL) 500 MG tablet Take 4 tablets (2,000 mg total) by mouth once. 4 tablet 0     Review of Systems  Constitutional: Negative for chills and fever.  Gastrointestinal: Positive for nausea and vomiting. Negative for abdominal pain, constipation and diarrhea.  Genitourinary: Negative for dysuria, frequency and urgency.   Physical Exam   Blood pressure 112/73, pulse 85, temperature 98.5 F (36.9 C), temperature source Oral, resp. rate 18, last menstrual period 07/15/2015, currently breastfeeding.  Physical Exam  Nursing note and vitals reviewed. Constitutional: She is oriented to person, place, and time. She appears well-developed and well-nourished. No distress.  HENT:  Head: Normocephalic.  Cardiovascular: Normal rate.   Respiratory: Effort normal.  GI: Soft. There is no tenderness. There is no rebound.  Neurological: She is alert and oriented to person, place, and time.  Skin: Skin is warm and dry.  Psychiatric: She has a normal mood and affect.    MAU Course  Procedures  MDM Patient has had PO phenergan and pepcid.  No vomiting while here.  Assessment and Plan   1. Nausea and vomiting during pregnancy prior to [redacted] weeks gestation   2. [redacted] weeks gestation of pregnancy   3. Gastroesophageal reflux disease, esophagitis presence not specified    DC home Comfort measures reviewed  1st Trimester precautions  Fetal kick counts RX: phenergan PRN, Pepcid PRN  Return to MAU as needed FU with OB as planned  Follow-up Information    HARPER,CHARLES A, MD .   Specialty:  Obstetrics and Gynecology Contact information: [address] [place] [address] [phone]            Tawnya CrookHogan, Heather Donovan 09/20/2015, 8:08  PM

## 2015-09-20 NOTE — MAU Note (Signed)
Patient presents with c/o N/V for the past 5 weeks. Patient states she trough up 10 times today and saw blood the last time.

## 2015-09-20 NOTE — Discharge Instructions (Signed)

## 2015-09-28 ENCOUNTER — Encounter: Payer: 59 | Admitting: Obstetrics and Gynecology

## 2015-11-10 LAB — OB RESULTS CONSOLE RPR: RPR: NONREACTIVE

## 2015-11-10 LAB — OB RESULTS CONSOLE GC/CHLAMYDIA
Chlamydia: NEGATIVE
Gonorrhea: NEGATIVE

## 2015-11-10 LAB — OB RESULTS CONSOLE HIV ANTIBODY (ROUTINE TESTING): HIV: NONREACTIVE

## 2015-11-10 LAB — OB RESULTS CONSOLE HEPATITIS B SURFACE ANTIGEN
HEP B S AG: NEGATIVE
HEP B S AG: NEGATIVE
Hepatitis B Surface Ag: NEGATIVE

## 2015-11-10 LAB — OB RESULTS CONSOLE RUBELLA ANTIBODY, IGM
RUBELLA: IMMUNE
RUBELLA: IMMUNE
Rubella: IMMUNE
Rubella: IMMUNE

## 2015-11-10 LAB — OB RESULTS CONSOLE ANTIBODY SCREEN: ANTIBODY SCREEN: NEGATIVE

## 2015-11-10 LAB — OB RESULTS CONSOLE ABO/RH: RH Type: POSITIVE

## 2016-02-06 NOTE — L&D Delivery Note (Signed)
Delivery Note  First Stage: Labor onset: midnight Augmentation : none Analgesia /Anesthesia intrapartum: epidural SROM at 0045  Second Stage: Complete dilation at 0255 Onset of pushing at 0335 FHR second stage category 2 with variable to 90s  Delivery of a viable female at 710357 by CNM in ROA position no nuchal cord Cord double clamped after cessation of pulsation, cut by family Cord blood sample collected    Third Stage: [facility] with 3 VC @ 0403 Placenta disposition: hospital disposal Uterine tone intermittent firm to boggy- bleeding brisk but responsive to uterine massage - given pitocin IVF bolus Fundus firm / LUS remains boggy - no clots in cervix  cytotec 800 MCG per rectum  Superficial supra-urethral laceration identified - pressure applied but bleeding persisted Straight catheter placed- moderate urine returned No extension identified into urethra 4-0 vicryl for hemostasis of 1.5cm lineal laceration Anesthesia for repair: epidural  Perineum and vagina intact  Est. Blood Loss (mL): 500  Complications: none  Mom to postpartum.  Baby to Couplet care / Skin to Skin.  Newborn: Birth Weight: 6 pounds 15.8 ounces Apgar Scores: 9-10 Feeding planned: breast  Marlinda MikeBAILEY, Baili Stang CNM, MSN, FACNM 04/17/2016, 4:21 AM

## 2016-03-20 LAB — OB RESULTS CONSOLE GBS: GBS: NEGATIVE

## 2016-04-17 ENCOUNTER — Encounter (HOSPITAL_COMMUNITY): Payer: Self-pay

## 2016-04-17 ENCOUNTER — Inpatient Hospital Stay (HOSPITAL_COMMUNITY): Payer: 59 | Admitting: Anesthesiology

## 2016-04-17 ENCOUNTER — Inpatient Hospital Stay (HOSPITAL_COMMUNITY)
Admission: AD | Admit: 2016-04-17 | Discharge: 2016-04-18 | DRG: 775 | Disposition: A | Discharge status: Home / Self Care | Payer: 59 | Source: Ambulatory Visit | Attending: Obstetrics and Gynecology | Admitting: Obstetrics and Gynecology

## 2016-04-17 DIAGNOSIS — K219 Gastro-esophageal reflux disease without esophagitis: Secondary | ICD-10-CM | POA: Diagnosis present

## 2016-04-17 DIAGNOSIS — Z3493 Encounter for supervision of normal pregnancy, unspecified, third trimester: Secondary | ICD-10-CM | POA: Diagnosis present

## 2016-04-17 DIAGNOSIS — O9081 Anemia of the puerperium: Secondary | ICD-10-CM | POA: Diagnosis not present

## 2016-04-17 DIAGNOSIS — Z8249 Family history of ischemic heart disease and other diseases of the circulatory system: Secondary | ICD-10-CM | POA: Diagnosis not present

## 2016-04-17 DIAGNOSIS — D62 Acute posthemorrhagic anemia: Secondary | ICD-10-CM | POA: Diagnosis not present

## 2016-04-17 DIAGNOSIS — Z3A39 39 weeks gestation of pregnancy: Secondary | ICD-10-CM

## 2016-04-17 DIAGNOSIS — D509 Iron deficiency anemia, unspecified: Secondary | ICD-10-CM | POA: Diagnosis present

## 2016-04-17 DIAGNOSIS — O9962 Diseases of the digestive system complicating childbirth: Secondary | ICD-10-CM | POA: Diagnosis present

## 2016-04-17 LAB — CBC
HCT: 26.2 % — ABNORMAL LOW (ref 36.0–46.0)
HCT: 23 % — ABNORMAL LOW (ref 36.0–46.0)
HEMOGLOBIN: 8.2 g/dL — AB (ref 12.0–15.0)
Hemoglobin: 7.4 g/dL — ABNORMAL LOW (ref 12.0–15.0)
MCH: 29.2 pg (ref 26.0–34.0)
MCH: 29.6 pg (ref 26.0–34.0)
MCHC: 31.3 g/dL (ref 30.0–36.0)
MCHC: 32.2 g/dL (ref 30.0–36.0)
MCV: 93.2 fL (ref 78.0–100.0)
MCV: 92 fL (ref 78.0–100.0)
PLATELETS: 155 10*3/uL (ref 150–400)
Platelets: 202 10*3/uL (ref 150–400)
RBC: 2.81 MIL/uL — ABNORMAL LOW (ref 3.87–5.11)
RBC: 2.5 MIL/uL — AB (ref 3.87–5.11)
RDW: 13.7 % (ref 11.5–15.5)
RDW: 13.8 % (ref 11.5–15.5)
WBC: 9.6 10*3/uL (ref 4.0–10.5)
WBC: 13.7 10*3/uL — AB (ref 4.0–10.5)

## 2016-04-17 LAB — TYPE AND SCREEN
ABO/RH(D): O POS
ANTIBODY SCREEN: NEGATIVE

## 2016-04-17 MED ORDER — LACTATED RINGERS IV SOLN
500.0000 mL | Freq: Once | INTRAVENOUS | Status: AC
  Administered 2016-04-17: 500 mL via INTRAVENOUS

## 2016-04-17 MED ORDER — LACTATED RINGERS IV SOLN: 500.0000 mL | Freq: Once | INTRAVENOUS | Status: DC

## 2016-04-17 MED ORDER — FLEET ENEMA 7-19 GM/118ML RE ENEM: 1.0000 | ENEMA | RECTAL | Status: DC | PRN

## 2016-04-17 MED ORDER — PHENYLEPHRINE 40 MCG/ML (10ML) SYRINGE FOR IV PUSH (FOR BLOOD PRESSURE SUPPORT)
PREFILLED_SYRINGE | INTRAVENOUS | Status: AC
  Filled 2016-04-17: qty 10

## 2016-04-17 MED ORDER — EPHEDRINE 5 MG/ML INJ
10.0000 mg | INTRAVENOUS | Status: DC | PRN
  Filled 2016-04-17: qty 4

## 2016-04-17 MED ORDER — SODIUM CHLORIDE 0.9 % IV SOLN
510.0000 mg | INTRAVENOUS | Status: DC
  Administered 2016-04-17: 510 mg via INTRAVENOUS
  Filled 2016-04-17: qty 17

## 2016-04-17 MED ORDER — WITCH HAZEL-GLYCERIN EX PADS: 1.0000 "application " | MEDICATED_PAD | CUTANEOUS | Status: DC | PRN

## 2016-04-17 MED ORDER — LIDOCAINE HCL (PF) 1 % IJ SOLN
30.0000 mL | INTRAMUSCULAR | Status: DC | PRN
  Filled 2016-04-17: qty 30

## 2016-04-17 MED ORDER — ACETAMINOPHEN 325 MG PO TABS
650.0000 mg | ORAL_TABLET | ORAL | Status: DC | PRN
  Administered 2016-04-17: 650 mg via ORAL
  Filled 2016-04-17: qty 2

## 2016-04-17 MED ORDER — BENZOCAINE-MENTHOL 20-0.5 % EX AERO
1.0000 "application " | INHALATION_SPRAY | CUTANEOUS | Status: DC | PRN
  Administered 2016-04-18: 1 via TOPICAL
  Filled 2016-04-17 (×2): qty 56

## 2016-04-17 MED ORDER — EPHEDRINE 5 MG/ML INJ: 10.0000 mg | INTRAVENOUS | Status: DC | PRN

## 2016-04-17 MED ORDER — LIDOCAINE HCL (PF) 1 % IJ SOLN
INTRAMUSCULAR | Status: DC | PRN
  Administered 2016-04-17: 3 mL via EPIDURAL
  Administered 2016-04-17: 5 mL via EPIDURAL

## 2016-04-17 MED ORDER — DIPHENHYDRAMINE HCL 50 MG/ML IJ SOLN: 12.5000 mg | INTRAMUSCULAR | Status: DC | PRN

## 2016-04-17 MED ORDER — POLYSACCHARIDE IRON COMPLEX 150 MG PO CAPS
150.0000 mg | ORAL_CAPSULE | Freq: Every day | ORAL | Status: DC
  Administered 2016-04-18: 150 mg via ORAL
  Filled 2016-04-17: qty 1

## 2016-04-17 MED ORDER — PHENYLEPHRINE 40 MCG/ML (10ML) SYRINGE FOR IV PUSH (FOR BLOOD PRESSURE SUPPORT): 80.0000 ug | PREFILLED_SYRINGE | INTRAVENOUS | Status: DC | PRN

## 2016-04-17 MED ORDER — MISOPROSTOL 200 MCG PO TABS
ORAL_TABLET | ORAL | Status: AC
  Filled 2016-04-17: qty 1

## 2016-04-17 MED ORDER — IBUPROFEN 600 MG PO TABS
600.0000 mg | ORAL_TABLET | Freq: Four times a day (QID) | ORAL | Status: DC
  Administered 2016-04-17 – 2016-04-18 (×6): 600 mg via ORAL
  Filled 2016-04-17 (×6): qty 1

## 2016-04-17 MED ORDER — FENTANYL 2.5 MCG/ML BUPIVACAINE 1/10 % EPIDURAL INFUSION (WH - ANES): 14.0000 mL/h | INTRAMUSCULAR | Status: DC | PRN

## 2016-04-17 MED ORDER — PHENYLEPHRINE 40 MCG/ML (10ML) SYRINGE FOR IV PUSH (FOR BLOOD PRESSURE SUPPORT)
80.0000 ug | PREFILLED_SYRINGE | INTRAVENOUS | Status: DC | PRN
  Filled 2016-04-17: qty 5

## 2016-04-17 MED ORDER — METHYLERGONOVINE MALEATE 0.2 MG/ML IJ SOLN
INTRAMUSCULAR | Status: AC
  Filled 2016-04-17: qty 1

## 2016-04-17 MED ORDER — SENNOSIDES-DOCUSATE SODIUM 8.6-50 MG PO TABS
2.0000 | ORAL_TABLET | ORAL | Status: DC
  Administered 2016-04-17: 2 via ORAL
  Filled 2016-04-17: qty 2

## 2016-04-17 MED ORDER — LACTATED RINGERS IV SOLN: INTRAVENOUS | Status: DC

## 2016-04-17 MED ORDER — DIBUCAINE 1 % RE OINT: 1.0000 "application " | TOPICAL_OINTMENT | RECTAL | Status: DC | PRN

## 2016-04-17 MED ORDER — SIMETHICONE 80 MG PO CHEW: 80.0000 mg | CHEWABLE_TABLET | ORAL | Status: DC | PRN

## 2016-04-17 MED ORDER — SOD CITRATE-CITRIC ACID 500-334 MG/5ML PO SOLN: 30.0000 mL | ORAL | Status: DC | PRN

## 2016-04-17 MED ORDER — ACETAMINOPHEN 325 MG PO TABS: 650.0000 mg | ORAL_TABLET | ORAL | Status: DC | PRN

## 2016-04-17 MED ORDER — ONDANSETRON HCL 4 MG/2ML IJ SOLN
4.0000 mg | Freq: Four times a day (QID) | INTRAMUSCULAR | Status: DC | PRN
  Administered 2016-04-17: 4 mg via INTRAVENOUS
  Filled 2016-04-17: qty 2

## 2016-04-17 MED ORDER — COCONUT OIL OIL: 1.0000 "application " | TOPICAL_OIL | Status: DC | PRN

## 2016-04-17 MED ORDER — MISOPROSTOL 200 MCG PO TABS
800.0000 ug | ORAL_TABLET | Freq: Once | ORAL | Status: AC
  Administered 2016-04-17: 800 ug via RECTAL

## 2016-04-17 MED ORDER — LACTATED RINGERS IV SOLN: 500.0000 mL | INTRAVENOUS | Status: DC | PRN

## 2016-04-17 MED ORDER — OXYTOCIN 40 UNITS IN LACTATED RINGERS INFUSION - SIMPLE MED
2.5000 [IU]/h | INTRAVENOUS | Status: DC
  Administered 2016-04-17: 2.5 [IU]/h via INTRAVENOUS
  Filled 2016-04-17: qty 1000

## 2016-04-17 MED ORDER — FENTANYL 2.5 MCG/ML BUPIVACAINE 1/10 % EPIDURAL INFUSION (WH - ANES)
14.0000 mL/h | INTRAMUSCULAR | Status: DC | PRN
  Administered 2016-04-17: 14 mL/h via EPIDURAL

## 2016-04-17 MED ORDER — FENTANYL 2.5 MCG/ML BUPIVACAINE 1/10 % EPIDURAL INFUSION (WH - ANES)
INTRAMUSCULAR | Status: AC
  Filled 2016-04-17: qty 100

## 2016-04-17 MED ORDER — OXYTOCIN BOLUS FROM INFUSION
500.0000 mL | Freq: Once | INTRAVENOUS | Status: AC
  Administered 2016-04-17: 500 mL via INTRAVENOUS

## 2016-04-17 MED ORDER — MAGNESIUM OXIDE 400 (241.3 MG) MG PO TABS
400.0000 mg | ORAL_TABLET | Freq: Every day | ORAL | Status: DC
  Administered 2016-04-18: 400 mg via ORAL
  Filled 2016-04-17 (×3): qty 1

## 2016-04-17 NOTE — MAU Note (Signed)
Pt presents complaining of contractions for the last hour. Denies leaking. Some bloody show. Reports good fetal movement.

## 2016-04-17 NOTE — Anesthesia Postprocedure Evaluation (Signed)
Anesthesia Post Note  Patient: Rebecca Cervantes  Procedure(s) Performed: * No procedures listed *  Patient location during evaluation: [facility] Anesthesia Type: Epidural Level of consciousness: awake and alert Pain management: pain level controlled Vital Signs Assessment: post-procedure vital signs reviewed and stable Respiratory status: spontaneous breathing Cardiovascular status: blood pressure returned to baseline Postop Assessment: no headache, patient able to bend at knees, no backache, no signs of nausea or vomiting, epidural receding and adequate PO intake Anesthetic complications: no        Last Vitals:  Vitals:   04/17/16 0515 04/17/16 0600  BP: 123/81 (!) 108/33  Pulse: (!) 121 74  Resp:  18  Temp:  37.3 C    Last Pain:  Vitals:   04/17/16 0600  TempSrc: Oral  PainSc: 0-No pain   Pain Goal: Patients Stated Pain Goal: 3 (04/17/16 0548)               Salome ArntSterling, Reyah Streeter Marie

## 2016-04-17 NOTE — Progress Notes (Signed)
INTERVAL NOTE:  S:  Sitting in bed, holding newborn "Piper", min cramping, no voids yet, small bleed, denies HA/NV/dizziness  O:  VSS, AAO x 3, NAD  FF bellow U  Small lochia  A / P:   PPD #0   Stable post partum  Periurethral lac w/ repair, encourage frequent voids   Severe IDA with ABL now, Jehova's Witness  - CBC pending this PM  - minimal effect w/ oral Fe supplement   - parenteral iron infusion today, rpt in 1 week for 2nd dose     Routine PP orders  Dayne Chait, CNM, MSN  04/17/2016 9:41 AM

## 2016-04-17 NOTE — Anesthesia Preprocedure Evaluation (Signed)
Anesthesia Evaluation  Patient identified by MRN, date of birth, ID band Patient awake    Reviewed: Allergy & Precautions, NPO status , Patient's Chart, lab work & pertinent test results  Airway Mallampati: II   Neck ROM: full    Dental   Pulmonary    breath sounds clear to auscultation       Cardiovascular negative cardio ROS   Rhythm:regular Rate:Normal     Neuro/Psych  Headaches, Seizures -,     GI/Hepatic   Endo/Other    Renal/GU      Musculoskeletal   Abdominal   Peds  Hematology  (+) anemia , JEHOVAH'S WITNESS  Anesthesia Other Findings   Reproductive/Obstetrics (+) Pregnancy                             Anesthesia Physical Anesthesia Plan  ASA: II  Anesthesia Plan: Epidural   Post-op Pain Management:    Induction: Intravenous  Airway Management Planned: Natural Airway  Additional Equipment:   Intra-op Plan:   Post-operative Plan:   Informed Consent: I have reviewed the patients History and Physical, chart, labs and discussed the procedure including the risks, benefits and alternatives for the proposed anesthesia with the patient or authorized representative who has indicated his/her understanding and acceptance.     Plan Discussed with: Anesthesiologist  Anesthesia Plan Comments:         Anesthesia Quick Evaluation

## 2016-04-17 NOTE — H&P (Signed)
  OB ADMISSION/ HISTORY & PHYSICAL:  Admission Date: 04/17/2016  1:32 AM  Admit Diagnosis: active labor  Rebecca Cervantes is a 21 y.o. female presenting for onset of labor.  Prenatal History: G2P1001   EDC : 04/20/2016, by Last Menstrual Period  Prenatal care at [facility]  Primary Ob Provider: Alice Reichertawson CNM - midwives Prenatal course complicated by poor weight gain / GERD  Prenatal Labs: ABO, Rh: O/Positive/-- (10/05 0000) Antibody: Negative (10/05 0000) Rubella: Immune (10/05 0000)  RPR: Nonreactive (10/05 0000)  HBsAg: Negative (10/05 0000)  HIV: Non-reactive (10/05 0000)  GTT: NL GBS: Negative (02/13 0000)   Medical / Surgical History :  Past medical history:  Past Medical History:  Diagnosis Date  . Anemia   . Chronic abdominal pain   . Chronic back pain   . Environmental allergies   . Headache(784.0)    Migraines  . Migraine   . Refusal of blood transfusions as patient is Jehovah's Witness   . Seizures (HCC)    last one 2 years ago     Past surgical history:  Past Surgical History:  Procedure Laterality Date  . DENTAL SURGERY      Family History:  Family History  Problem Relation Age of Onset  . Asthma Mother   . Hypertension Mother   . Other Mother     woke up with anesthesia  . Hypertension Sister   . Hypertension Maternal Grandmother      Social History:  reports that she has never smoked. She has never used smokeless tobacco. She reports that she uses drugs, including Marijuana, about 2 times per week. She reports that she does not drink alcohol.   Allergies: Morphine and related    Current Medications at time of admission:  Prenatal vitamin magnesium  Review of Systems: Active FM onset of ctx @ midnight bloody show present  Physical Exam:  VS: Blood pressure 107/67, pulse 84, height 5\' 1"  (1.549 m), weight 56.2 kg (124 lb), last menstrual period 07/15/2015, SpO2 100 %, currently breastfeeding.  General: alert and  oriented, appears comfortable with epidural Heart: RRR Lungs: Clear lung fields Abdomen: Gravid, soft and non-tender, non-distended / uterus: gravid Extremities: no edema  Genitalia / VE: Dilation: 10 Effacement (%): 100 Station: +1 Exam by:: T Ennio Houp  FHR: baseline rate 120 / variability moderate / accelerations + / no decelerations TOCO: Q2-3  Assessment: 39.[redacted] weeks gestation active stage of labor FHR category 1   Plan:  Expectant management Anticipate SVB  Dr Cherly Hensenousins notified of admission / plan of care   Marlinda MikeBAILEY, Hussein Macdougal CNM, MSN, [facility] 04/17/2016, 3:03 AM

## 2016-04-17 NOTE — Progress Notes (Signed)
Notified of pt arrival in MAU and exam. Ok to admit to labor and delivery and CNM on the way.

## 2016-04-17 NOTE — Anesthesia Procedure Notes (Signed)
Epidural Patient location during procedure: OB Start time: 04/17/2016 2:23 AM End time: 04/17/2016 2:35 AM  Staffing Anesthesiologist: Chaney MallingHODIERNE, Floriene Jeschke Performed: anesthesiologist   Preanesthetic Checklist Completed: patient identified, site marked, pre-op evaluation, timeout performed, IV checked, risks and benefits discussed and monitors and equipment checked  Epidural Patient position: sitting Prep: DuraPrep Patient monitoring: heart rate, cardiac monitor, continuous pulse ox and blood pressure Approach: midline Location: L2-L3 Injection technique: LOR air  Needle:  Needle type: Tuohy  Needle gauge: 17 G Needle length: 9 cm Needle insertion depth: 4 cm Catheter type: closed end flexible Catheter size: 19 Gauge Catheter at skin depth: 10 cm Test dose: negative and Other  Assessment Events: blood not aspirated, injection not painful, no injection resistance and negative IV test  Additional Notes Informed consent obtained prior to proceeding including risk of failure, 1% risk of PDPH, risk of minor discomfort and bruising.  Discussed rare but serious complications including epidural abscess, permanent nerve injury, epidural hematoma.  Discussed alternatives to epidural analgesia and patient desires to proceed.  Timeout performed pre-procedure verifying patient name, procedure, and platelet count.  Patient tolerated procedure well. Reason for block:procedure for pain

## 2016-04-18 LAB — RPR: RPR Ser Ql: NONREACTIVE

## 2016-04-18 MED ORDER — POLYSACCHARIDE IRON COMPLEX 150 MG PO CAPS: 150.0000 mg | ORAL_CAPSULE | Freq: Two times a day (BID) | ORAL | 1 refills | Status: DC

## 2016-04-18 MED ORDER — IBUPROFEN 600 MG PO TABS: 600.0000 mg | ORAL_TABLET | Freq: Four times a day (QID) | ORAL | 0 refills | Status: DC

## 2016-04-18 MED ORDER — ACETAMINOPHEN 325 MG PO TABS: 650.0000 mg | ORAL_TABLET | ORAL | 0 refills | Status: DC | PRN

## 2016-04-18 NOTE — Lactation Note (Signed)
This note was copied from a baby's chart. Lactation Consultation Note: This is mother's second child . She reports that she breastfed for 3 yrs with who is now 4 yrs. Mother reports that infant is breastfeed well. . Mother reports she has easy flowing colostrum and she hears infant swallow. Mother does complain of slight sore nipples. Mother was given comfort gels. Advised mother to hand express and apply to nipples and air dry. Mother just finished a 15 min feeding. Suggested that mother wait for infant to Reviewed basic breastfeeding. Advised mother to continue to cue base feed and feed infant 8-12 times in 24 hours. Discussed cluster feeding . Mother was informed to follow up with LC with OP,BFSGs or phone line.   Patient Name: Rebecca Cervantes [id number]'s Date: 04/18/2016 Reason for consult: Initial assessment   Maternal Data    Feeding Feeding Type: Breast Fed Length of feed: 15 min (per mother)  LATCH Score/Interventions                      Lactation Tools Discussed/Used     Consult Status Consult Status: Follow-up Date: 04/18/16 Follow-up type: In-patient    Stevan BornKendrick, [name] [facility] 04/18/2016, 2:14 PM

## 2016-04-18 NOTE — Discharge Summary (Signed)
Obstetric Discharge Summary Reason for Admission: onset of labor 39.4 wks, CNM care. Uncomplicated pregnancy. G2P1001, low maternal weight. Jehovah's Witness  Prenatal Procedures: ultrasound Intrapartum Procedures: spontaneous vaginal delivery Postpartum Procedures: none Complications-Operative and Postpartum: none. Chronic anemia.  Hemoglobin  Date Value Ref Range Status  04/17/2016 7.4 (L) 12.0 - 15.0 g/dL Final   HCT  Date Value Ref Range Status  04/17/2016 23.0 (L) 36.0 - 46.0 % Final    Physical Exam:  General: alert and cooperative Lochia: appropriate Uterine Fundus: firm DVT Evaluation: No evidence of DVT seen on physical exam.  Discharge Diagnoses: Term Pregnancy-delivered  Discharge Information: Date: 04/18/2016 Activity: pelvic rest Diet: routine Medications: PNV, Ibuprofen and Iron Condition: stable Instructions: refer to practice specific booklet Discharge to: home Follow-up Information    Neta Mendsaniela C Paul, CNM Follow up in 6 week(s).   Specialty:  Obstetrics and Gynecology Contact information: [place] [address] [place] [id number] [phone]           Newborn Data: Live born female  Birth Weight: 6 lb 15.8 oz (3170 g) APGAR: 9, 10  Home with mother.  Rebecca Cervantes 04/18/2016, 11:09 AM

## 2016-04-18 NOTE — Clinical Social Work Maternal (Signed)
CLINICAL SOCIAL WORK MATERNAL/CHILD NOTE  Patient Details  Name: Rebecca Cervantes MRN: [id number] Date of Birth: 05/13/1995  Date:  01/10/2017  Clinical Social Worker Initiating Note:  Laurey Arrow Date/ Time Initiated:  04/18/16/1557     Child's Name:  Rebecca Cervantes   Legal Guardian:  Mother (FOB is Garner Gavel telephone number is [phone])   Need for Interpreter:  None   Date of Referral:  2016-08-01     Reason for Referral:  Current Substance Use/Substance Use During Pregnancy  (hx of marijuana use during pregnancy.)   Referral Source:  [facility]   Address:  [place] [address]  Phone number:  [phone]   Household Members:  Self, Siblings, Parents, Minor Children   Natural Supports (not living in the home):  Immediate Family, Extended Family   Professional Supports: None   Employment: Unemployed   Type of Work:     Education:  Database administrator Resources:  Multimedia programmer (MOB was provided informatio to apply for Medicaid for infant. )   Other Resources:      Cultural/Religious Considerations Which May Impact Care:  Per [employer] Face Sheet, MOB attends a [employer].   Strengths:  Ability to meet basic needs , Pediatrician chosen , Home prepared for child    Risk Factors/Current Problems:  Substance Use    Cognitive State:  Alert , Able to Concentrate , Insightful , Linear Thinking , Goal Oriented    Mood/Affect:  Bright , Happy , Interested , Comfortable    CSW Assessment: CSW met with MOB to complete an assessment for hx THC.  MOB was receptive to meeting with CSW.  When CSW arrived, MOB was in bed engaging in skin to skin with infant.  MOB appeared comfortable and and relaxed. MOB's mother Deyna Carbon) was present when CSW arrived, and MOB gave CSW permission to complete the assessment while MOB's mother was present.  CSW inquired about MOB's substance use hx, and MOB  acknowledged the use of marijuana throughout pregnancy.  MOB stated that MOB utilized marijuana during pregnancy to assist with decreasing nausea and to help increase MOB's appetite. MOB reported that MOB had morning sickness during the entire pregnancy and marijuana helped to minimize MOB's symptoms. MOB denied the use of any substance during pregnancy. CSW informed MOB of the hospital's drug screen policy, and informed MOB of the 2 screenings for the infant. MOB appeared understanding and communicated that MOB was aware the infant's UDS was positive. CSW shared with MOB that the infant's UDS was positive and CSW will make a report to [facility] CPS. CSW informed MOB that CSW will also continue to to monitor infant's CDS and will report the results to CPS. MOB did not have any questions regarding the hospital's policy. CSW offered MOB resources and referrals for SA, and MOB declined. MOB reported that MOB's last use of marijuana was February 2018. MOB denied a hx of CPS involvement.  MOB asked specific questions about the CPS investigation process; CSW answered MOB's questions.  CSW encouraged MOB to be honest and forthcoming with MOB's CPS worker.   CSW educated MOB about PPD. CSW informed MOB of possible supports and interventions to decrease PPD.  CSW also encouraged MOB to seek medical attention if needed for increased signs and symptoms for PPD. MOB denied any PPD signs and symptpos wth MOB's oldest child. CSW reviewed safe sleep, and SIDS. MOB was knowledgeable and asked appropriate questions.  MOB  communicated that she has a basinet for the baby, and feels prepared for the infant.  MOB did not have any further questions, concerns, or needs at this time. CSW thanked MOB for meeting with CSW and provided MOB with CSW contact information.  CSW left a voice message for CPS Intake Worker, Delray Alt, to make a [facility] CPS report. There are not barriers to d/c.     CSW Plan/Description:   Information/Referral to [employer] , Child Protective Service Report , No Further Intervention Required/No Barriers to Discharge, Patient/Family Education  (CSW will continue to monitor CDS and reports results to [place], MSW, LCSW Clinical Social Work [phone]    Dimple Nanas, LCSW 04/18/2016, 4:00 PM

## 2016-04-18 NOTE — Progress Notes (Signed)
Postpartum day 1, SVD (2nd baby)  S:  Sitting in bed, holding newborn "Piper", min cramping, feels well  O:  BP (!) 109/54 (BP Location: Right Arm)   Pulse (!) 52   Temp 98.9 F (37.2 C)   Resp 18   Ht 5\' 1"  (1.549 m)   Wt 124 lb (56.2 kg)   LMP 07/15/2015   SpO2 100%   Breastfeeding? Unknown   BMI 23.43 kg/m   FF bellow U-2  Small lochia CBC    Component Value Date/Time   WBC 13.7 (H) 04/17/2016 1508   RBC 2.50 (L) 04/17/2016 1508   HGB 7.4 (L) 04/17/2016 1508   HCT 23.0 (L) 04/17/2016 1508   PLT 155 04/17/2016 1508   MCV 92.0 04/17/2016 1508   MCH 29.6 04/17/2016 1508   MCHC 32.2 04/17/2016 1508   RDW 13.8 04/17/2016 1508   LYMPHSABS 2.6 08/27/2015 0732   MONOABS 0.2 08/27/2015 0732   EOSABS 0.1 08/27/2015 0732   BASOSABS 0.0 08/27/2015 0732   Rh positive, Rubella Immune  A / P:   PPD #1   Stable post partum  Periurethral lac w/ repair, managing pain well    Severe IDA with ABL now, Jehova's Witness but well adjusted since anemia entire pregnancy Continue oral iron Discharge home PP care reviewed F/up CNM in 6 wks.   V.Kamrie Fanton, MD

## 2016-04-19 NOTE — Progress Notes (Signed)
CSW made CPS report with [place] CPS worker, Pam Miller, for infant's positive UDS for marijuana.  CPS will follow-up with family within 48 hours.   Laporscha Linehan Boyd-Gilyard, MSW, LCSW Clinical Social Work [phone]  

## 2016-07-09 IMAGING — MR MR HEAD W/O CM
9 of 11 series · 34 of 48 positions shown · non-contrast
Comparison: Head CT without contrast 04/05/2008.

CLINICAL DATA: 18-year-old female with seizure-like episodes.
Initial encounter.

EXAM:
MRI HEAD WITHOUT CONTRAST
TECHNIQUE: Multiplanar, multiecho pulse sequences of the brain and surrounding
structures were obtained without intravenous contrast.

[Series 2: FLAIR · sagittal · 5.0mm · 0.47mm/px · 3 of 23 slices shown (1 of 2)]
[im 1/23]
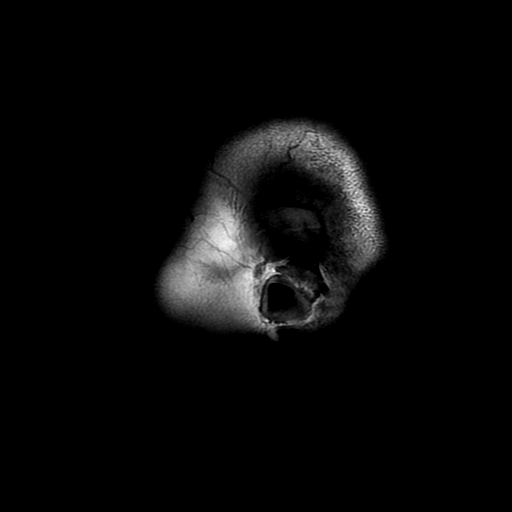
[im 12/23]
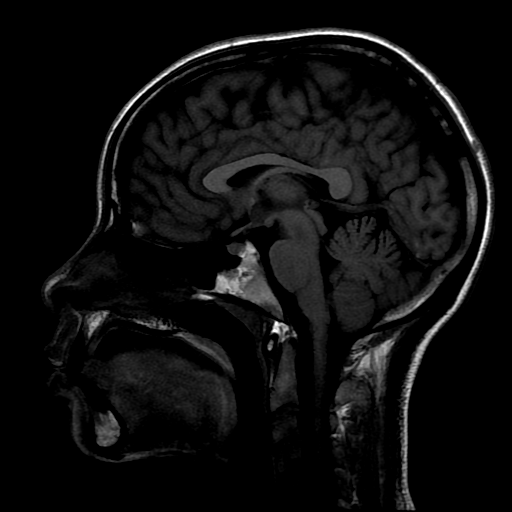
[im 23/23]
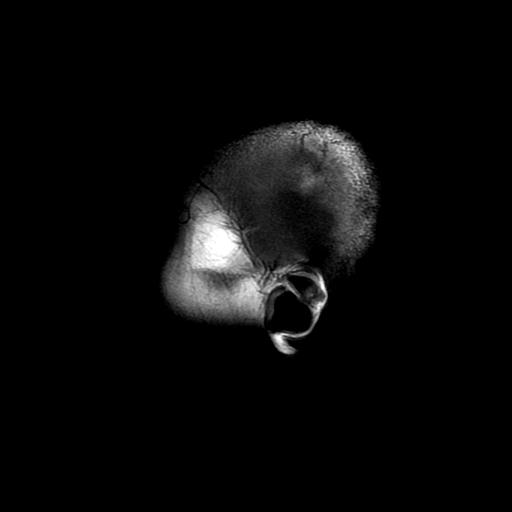

[Series 4: DWI · axial · 3.0mm · 0.94mm/px · z∈[-86,+47]mm · 9 of 94 slices shown (1 of 4)]
[im 1/94]
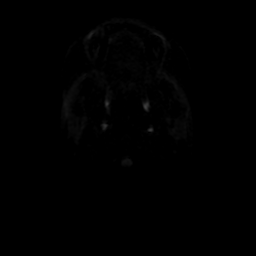
[im 12/94]
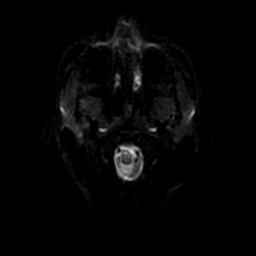
[im 24/94]
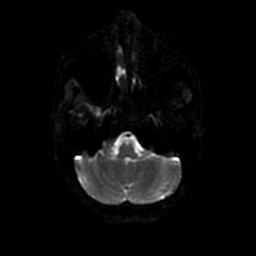
[im 35/94]
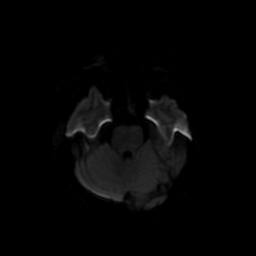
[im 47/94]
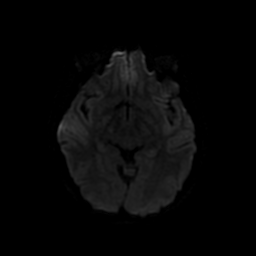
[im 59/94]
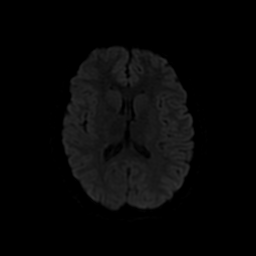
[im 70/94]
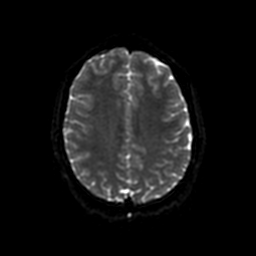
[im 82/94]
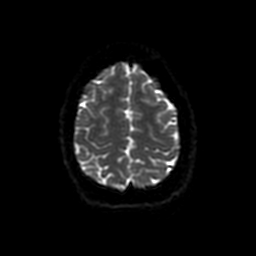
[im 94/94]
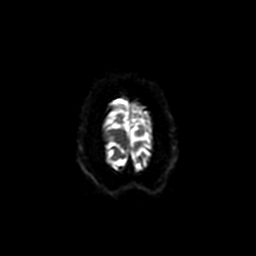

[Series 5: T2 · axial · 5.0mm · 0.47mm/px · z∈[-86,+47]mm · 2 of 24 slices shown (1 of 2)]
[im 1/24]
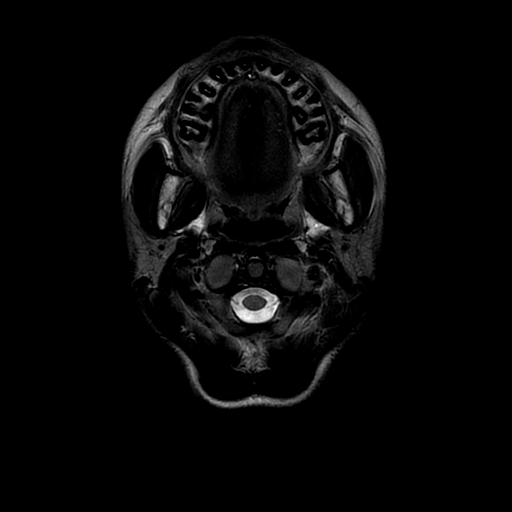
[im 24/24]
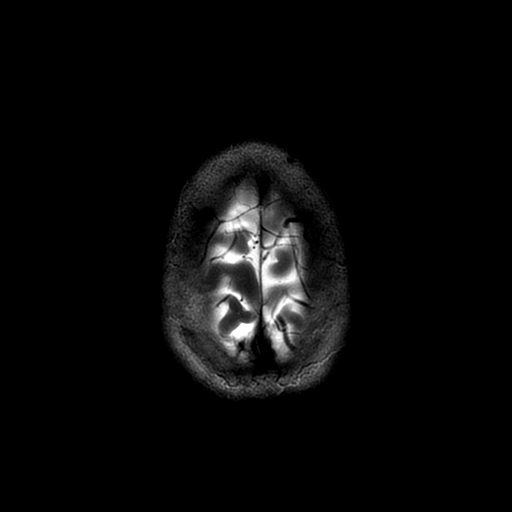

[Series 6: FLAIR · axial · 5.0mm · 0.47mm/px · z∈[-86,+47]mm · 2 of 24 slices shown (2 of 2)]
[im 1/24]
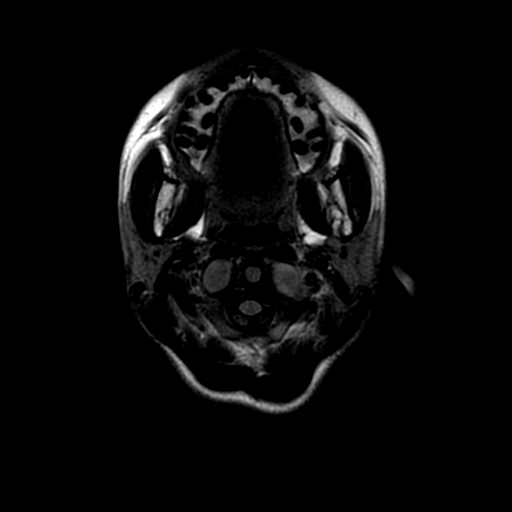
[im 24/24]
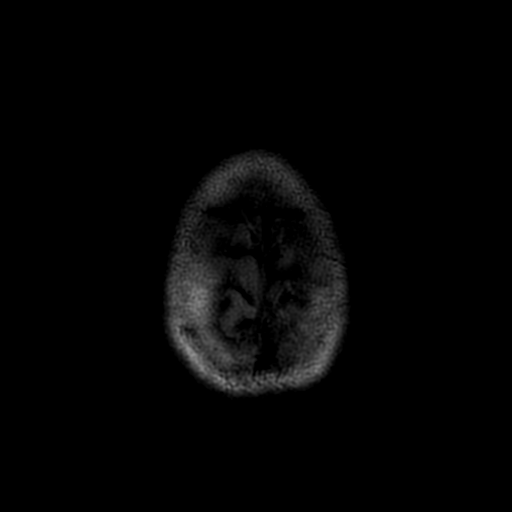

[Series 7: DWI · coronal · 5.0mm · 0.94mm/px · 6 of 64 slices shown (2 of 4)]
[im 1/64]
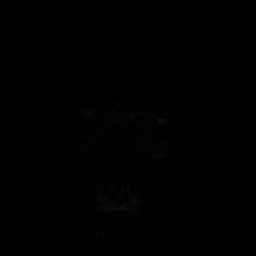
[im 13/64]
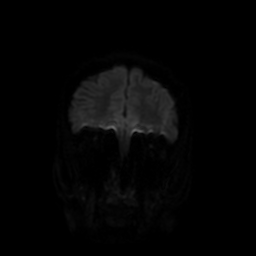
[im 26/64]
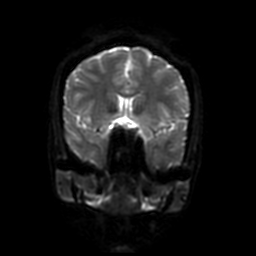
[im 38/64]
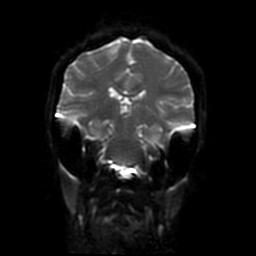
[im 51/64]
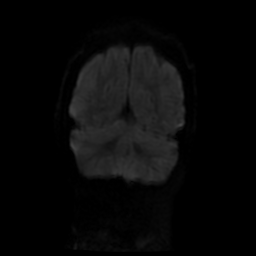
[im 64/64]
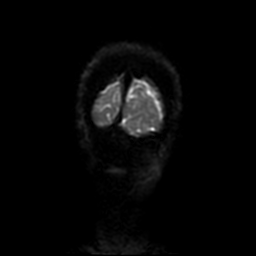

[Series 10: T2 fat-sat · coronal · 3.0mm · 0.43mm/px · 2 of 25 slices shown]
[im 1/25]
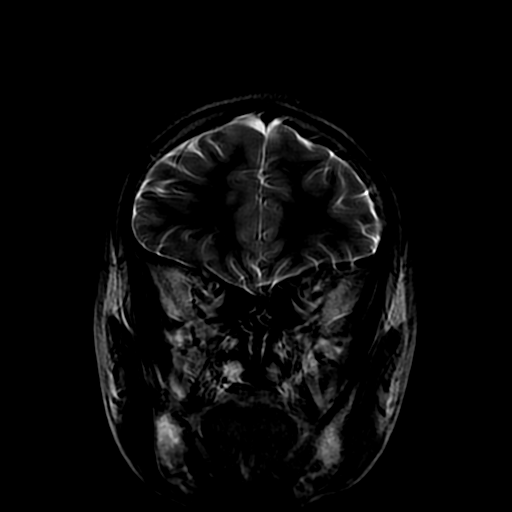
[im 25/25]
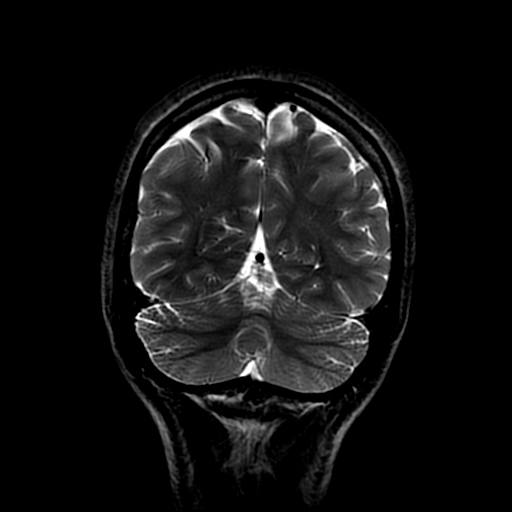

[Series 11: T2 · coronal · 5.0mm · 0.47mm/px · 3 of 27 slices shown (2 of 2)]
[im 1/27]
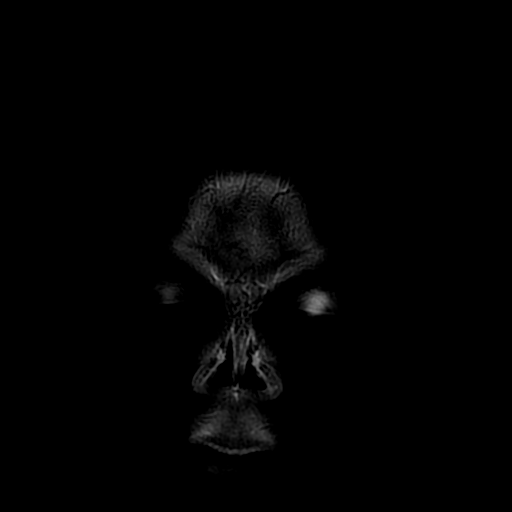
[im 14/27]
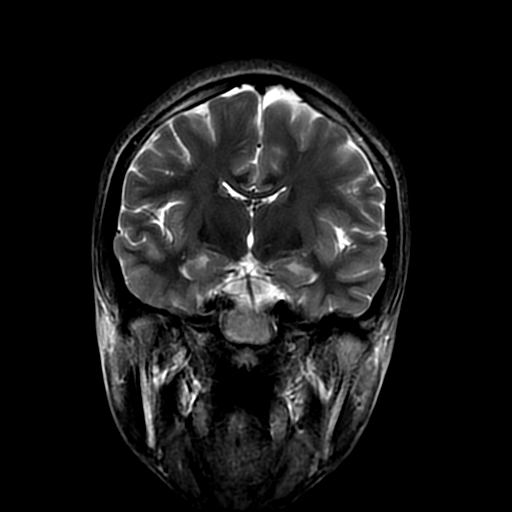
[im 27/27]
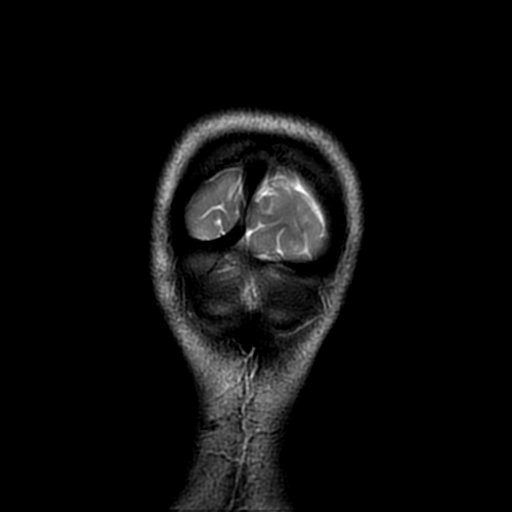

[Series 400: DWI · axial · 3.0mm · 0.94mm/px · z∈[-86,+47]mm · 4 of 47 slices shown (3 of 4)]
[im 1/47]
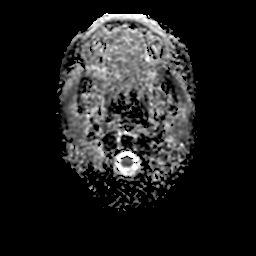
[im 16/47]
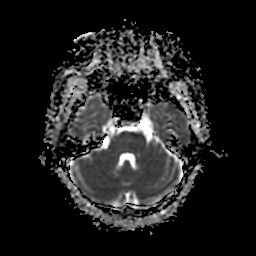
[im 31/47]
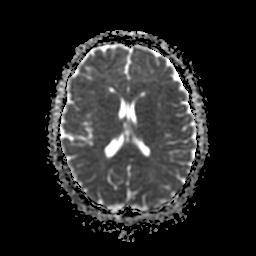
[im 47/47]
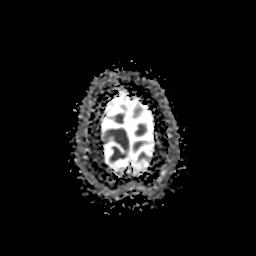

[Series 700: DWI · coronal · 5.0mm · 0.94mm/px · 3 of 32 slices shown (4 of 4)]
[im 1/32]
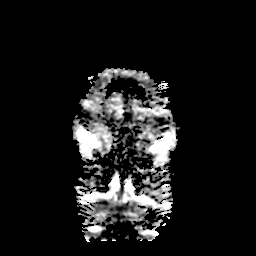
[im 16/32]
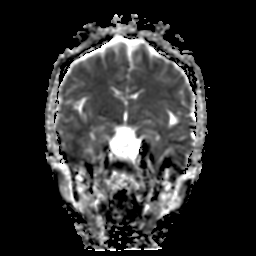
[im 32/32]
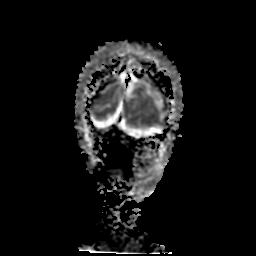

[34 of 48 positions shown; findings below may reference images not displayed]

FINDINGS: Cerebral volume is within normal limits. Cavum septum callosum,
normal anatomic variant. No ventriculomegaly. No restricted
diffusion to suggest acute infarction. No midline shift, mass
effect, evidence of mass lesion, extra-axial collection or acute
intracranial hemorrhage. Cervicomedullary junction and pituitary are
within normal limits. Major intracranial vascular flow voids are
within normal limits. Negative visualized cervical spine.

Thin slice coronal T2 imaging of the brain is mildly degraded by
motion. Mesial temporal lobe structures appear symmetric and within
normal limits. Gray and white matter signal is within normal limits
throughout the brain. No encephalomalacia or chronic cerebral blood
products identified.

Grossly normal visualized internal auditory structures. Visualized
paranasal sinuses and mastoids are clear. Orbits soft tissues appear
normal. Visualized scalp soft tissues are within normal limits.
Normal bone marrow signal.
IMPRESSION: Normal non contrast MRI appearance of the brain.

## 2017-03-03 ENCOUNTER — Emergency Department (HOSPITAL_COMMUNITY)
Admission: EM | Admit: 2017-03-03 | Discharge: 2017-03-03 | Disposition: A | Discharge status: Home / Self Care | Payer: Self-pay | Attending: Emergency Medicine | Admitting: Emergency Medicine

## 2017-03-03 ENCOUNTER — Encounter (HOSPITAL_COMMUNITY): Payer: Self-pay

## 2017-03-03 ENCOUNTER — Other Ambulatory Visit: Payer: Self-pay

## 2017-03-03 DIAGNOSIS — J111 Influenza due to unidentified influenza virus with other respiratory manifestations: Secondary | ICD-10-CM | POA: Insufficient documentation

## 2017-03-03 DIAGNOSIS — R69 Illness, unspecified: Secondary | ICD-10-CM

## 2017-03-03 DIAGNOSIS — Z79899 Other long term (current) drug therapy: Secondary | ICD-10-CM | POA: Insufficient documentation

## 2017-03-03 LAB — INFLUENZA PANEL BY PCR (TYPE A & B)
Influenza A By PCR: POSITIVE — AB
Influenza B By PCR: NEGATIVE

## 2017-03-03 LAB — RAPID STREP SCREEN (MED CTR MEBANE ONLY): Streptococcus, Group A Screen (Direct): NEGATIVE

## 2017-03-03 MED ORDER — OSELTAMIVIR PHOSPHATE 75 MG PO CAPS: 75.0000 mg | ORAL_CAPSULE | Freq: Two times a day (BID) | ORAL | 0 refills | Status: DC

## 2017-03-03 MED ORDER — IBUPROFEN 800 MG PO TABS
800.0000 mg | ORAL_TABLET | Freq: Once | ORAL | Status: AC
Start: 2017-03-03 — End: 2017-03-03
  Administered 2017-03-03: 800 mg via ORAL
  Filled 2017-03-03: qty 1

## 2017-03-03 NOTE — ED Triage Notes (Signed)
Pt presents for evaluation of sore throat, body aches, headache and dry cough. Started suddenly yesterday. Reports chills.

## 2017-03-03 NOTE — ED Provider Notes (Signed)
[facility] EMERGENCY DEPARTMENT Provider Note   CSN: [id number] Arrival date & time: 03/03/17  0859     History   Chief Complaint Chief Complaint  Patient presents with  . Sore Throat  . Generalized Body Aches    HPI Rebecca Cervantes is a 22 y.o. female.  HPI   22 year old female presents today with complaints of sore throat body aches and headache.  Patient notes that he yesterday afternoon she had acute onset of sore throat, and fatigue.  She notes she developed a nonproductive cough at that time.  She notes that she continues to feel body aches and fatigue today.  She reports she is otherwise healthy, occasionally smokes Black and mild cigars.  She denies any objective fever at home, denies any chest pain or shortness of breath.  Patient notes continued rhinorrhea and nasal congestion.  Patient reports that she did not get the influenza vaccine, but has 2 small children at home.  She denies any neck stiffness, confusion.  Patient denies any abdominal pain, dysuria, or any other urinary symptoms.    Past Medical History:  Diagnosis Date  . Anemia   . Chronic abdominal pain   . Chronic back pain   . Environmental allergies   . Headache(784.0)    Migraines  . Migraine   . Refusal of blood transfusions as patient is Jehovah's Witness   . Seizures (HCC)    last one 2 years ago    Patient Active Problem List   Diagnosis Date Noted  . Normal labor and delivery 04/17/2016  . IDA (iron deficiency anemia) 04/17/2016  . SVD (spontaneous vaginal delivery) 04/17/2016  . Postpartum care following vaginal delivery (3/13) 04/17/2016  . Seizure (HCC) 08/16/2014  . Chronic back pain 08/16/2014    Past Surgical History:  Procedure Laterality Date  . DENTAL SURGERY      OB History    Gravida Para Term Preterm AB Living   2 2 2  0 0 2   SAB TAB Ectopic Multiple Live Births   0 0 0 0 2       Home Medications    Prior to Admission medications     Medication Sig Start Date End Date Taking? Authorizing Provider  acetaminophen (TYLENOL) 325 MG tablet Take 2 tablets (650 mg total) by mouth every 4 (four) hours as needed (for pain scale < 4). 04/18/16   Shea EvansMody, Vaishali, MD  famotidine (PEPCID) 40 MG tablet Take 1 tablet (40 mg total) by mouth daily. 09/20/15   Armando ReichertHogan, Heather D, CNM  ibuprofen (ADVIL,MOTRIN) 600 MG tablet Take 1 tablet (600 mg total) by mouth every 6 (six) hours. 04/18/16   Shea EvansMody, Vaishali, MD  iron polysaccharides (NIFEREX) 150 MG capsule Take 1 capsule (150 mg total) by mouth 2 (two) times daily. 04/18/16   Shea EvansMody, Vaishali, MD  oseltamivir (TAMIFLU) 75 MG capsule Take 1 capsule (75 mg total) by mouth every 12 (twelve) hours. 03/03/17   Macai Sisneros, Tinnie GensJeffrey, PA-C  Prenatal Vit-Fe Fumarate-FA (PRENATAL MULTIVITAMIN) TABS tablet Take 1 tablet by mouth daily at 12 noon.    [provider]    Family History Family History  Problem Relation Age of Onset  . Asthma Mother   . Hypertension Mother   . Other Mother        woke up with anesthesia  . Hypertension Sister   . Hypertension Maternal Grandmother     Social History Social History   Tobacco Use  . Smoking status: Never Smoker  .  Smokeless tobacco: Never Used  Substance Use Topics  . Alcohol use: No  . Drug use: Yes    Frequency: 2.0 times per week    Types: Marijuana     Allergies   Morphine and related   Review of Systems Review of Systems  All other systems reviewed and are negative.    Physical Exam Updated Vital Signs BP 113/74 (BP Location: Right Arm)   Pulse (!) 104   Temp 99.3 F (37.4 C) (Oral)   Resp 16   LMP 02/28/2017 (Exact Date)   SpO2 100%   Physical Exam  Constitutional: She is oriented to person, place, and time. She appears well-developed and well-nourished.  HENT:  Head: Normocephalic and atraumatic.  Right Ear: Tympanic membrane normal.  Left Ear: Tympanic membrane normal.  Mouth/Throat: Oropharynx is clear and moist and  mucous membranes are normal. No uvula swelling. No oropharyngeal exudate, posterior oropharyngeal edema or posterior oropharyngeal erythema. Tonsils are 0 on the right. Tonsils are 0 on the left. No tonsillar exudate.  Eyes: Conjunctivae are normal. Pupils are equal, round, and reactive to light. Right eye exhibits no discharge. Left eye exhibits no discharge. No scleral icterus.  Neck: Normal range of motion. No JVD present. No tracheal deviation present.  Cardiovascular: Regular rhythm and normal heart sounds. Exam reveals no gallop and no friction rub.  No murmur heard. Pulmonary/Chest: Effort normal and breath sounds normal. No stridor. No respiratory distress. She has no wheezes. She has no rales. She exhibits no tenderness.  Abdominal: Soft. She exhibits no distension and no mass. There is no tenderness. There is no rebound and no guarding. No hernia.  Neurological: She is alert and oriented to person, place, and time. Coordination normal.  Psychiatric: She has a normal mood and affect. Her behavior is normal. Judgment and thought content normal.  Nursing note and vitals reviewed.    ED Treatments / Results  Labs (all labs ordered are listed, but only abnormal results are displayed) Labs Reviewed  RAPID STREP SCREEN (NOT AT [facility])  CULTURE, GROUP A STREP [facility])  INFLUENZA PANEL BY PCR (TYPE A & B)    EKG  EKG Interpretation None       Radiology No results found.  Procedures Procedures (including critical care time)  Medications Ordered in ED Medications  ibuprofen (ADVIL,MOTRIN) tablet 800 mg (not administered)     Initial Impression / Assessment and Plan / ED Course  I have reviewed the triage vital signs and the nursing notes.  Pertinent labs & imaging results that were available during my care of the patient were reviewed by me and considered in my medical decision making (see chart for details).    Final Clinical Impressions(s) / ED Diagnoses   Final  diagnoses:  Influenza-like illness    Labs: Rapid strep, influenza PCR  Imaging:  Consults:  Therapeutics:  Discharge Meds: Tamiflu  Assessment/Plan: 22 year old female presents today with likely viral infection.  I have high suspicion for influenza given her acute onset of symptoms.  Patient has clear lung sounds reassuring oxygen saturation, no signs of acute pneumonia on exam.  She has a temperature of 99 3 with no medications prior to arrival.  Patient has no significant past medical history that would place her at adverse risk of poor outcome from influenza.  Patient reports she has 2 small children at home and would like testing here.  I find this reasonable given my high clinical suspicion.  I discussed the indications for actually taking  Tamiflu along with the risks of taking it.  Patient would like a prescription if she is positive she will decide on using medication on her own.  Patient understands the risks and benefits of testing and treatment, she is well-appearing and requires no further evaluation here in the ED setting.  Patient will follow at home with her test results on my chart, she is given strict return precautions, both her and her significant other verbalized understanding and agreement to today's plan and had no further questions or concerns at the time of discharge.    ED Discharge Orders        Ordered    oseltamivir (TAMIFLU) 75 MG capsule  Every 12 hours     03/03/17 1034       Eyvonne Mechanic, PA-C 03/03/17 1035    Terrilee Files, MD 03/05/17 [phone]

## 2017-03-03 NOTE — Discharge Instructions (Signed)
Please read attached information. If you experience any new or worsening signs or symptoms please return to the emergency room for evaluation. Please follow-up with your primary care provider or specialist as discussed. Please use medication prescribed only as directed and discontinue taking if you have any concerning signs or symptoms.   °

## 2017-03-05 LAB — CULTURE, GROUP A STREP (THRC)

## 2017-04-12 ENCOUNTER — Other Ambulatory Visit: Payer: Self-pay

## 2017-04-12 ENCOUNTER — Encounter (HOSPITAL_COMMUNITY): Payer: Self-pay | Admitting: Emergency Medicine

## 2017-04-12 ENCOUNTER — Emergency Department (HOSPITAL_COMMUNITY)
Admission: EM | Admit: 2017-04-12 | Discharge: 2017-04-12 | Disposition: A | Discharge status: Home / Self Care | Payer: Self-pay | Attending: Emergency Medicine | Admitting: Emergency Medicine

## 2017-04-12 DIAGNOSIS — Y999 Unspecified external cause status: Secondary | ICD-10-CM | POA: Insufficient documentation

## 2017-04-12 DIAGNOSIS — T162XXA Foreign body in left ear, initial encounter: Secondary | ICD-10-CM | POA: Insufficient documentation

## 2017-04-12 DIAGNOSIS — Y92009 Unspecified place in unspecified non-institutional (private) residence as the place of occurrence of the external cause: Secondary | ICD-10-CM | POA: Insufficient documentation

## 2017-04-12 DIAGNOSIS — Z79899 Other long term (current) drug therapy: Secondary | ICD-10-CM | POA: Insufficient documentation

## 2017-04-12 DIAGNOSIS — Y9384 Activity, sleeping: Secondary | ICD-10-CM | POA: Insufficient documentation

## 2017-04-12 DIAGNOSIS — X58XXXA Exposure to other specified factors, initial encounter: Secondary | ICD-10-CM | POA: Insufficient documentation

## 2017-04-12 MED ORDER — OFLOXACIN 0.3 % OT SOLN: 5.0000 [drp] | Freq: Two times a day (BID) | OTIC | 0 refills | Status: AC

## 2017-04-12 MED ORDER — LIDOCAINE HCL (PF) 1 % IJ SOLN
2.0000 mL | Freq: Once | INTRAMUSCULAR | Status: DC
Start: 2017-04-12 — End: 2017-04-12
  Filled 2017-04-12: qty 5

## 2017-04-12 NOTE — Discharge Instructions (Signed)
Apply antibiotic drops as prescribed.  I have attached instructions for how to apply eardrops.  Alternate ibuprofen and Tylenol as needed for pain.  You may also apply a cold compress or ice pack for comfort.  Follow-up with primary care physician if symptoms persist.  Return to the emergency department if any concerning signs or symptoms develop such as fever, swelling or redness of the ear.

## 2017-04-12 NOTE — ED Triage Notes (Signed)
Pt to ER states "something is crawling in my ear." states stayed at a friends house last night and woke up feeling like something was crawling in here. VSS

## 2017-04-12 NOTE — ED Provider Notes (Signed)
[name] Provider Note   CSN: [id number] Arrival date & time: 04/12/17  0701     History   Chief Complaint Chief Complaint  Patient presents with  . Foreign Body in Ear    HPI Rebecca Cervantes is a 22 y.o. female presents today for evaluation of acute onset, constant sharp left ear pain.  She states that when she awoke after staying at a friend's house last night she felt that there was something crawling in her ear.  Pain worsens when the insect moves.  No radiation of pain.  No fevers or chills. States her father tried to flush out the insect at home but was unsuccessful.    The history is provided by the patient.    Past Medical History:  Diagnosis Date  . Anemia   . Chronic abdominal pain   . Chronic back pain   . Environmental allergies   . Headache(784.0)    Migraines  . Migraine   . Refusal of blood transfusions as patient is Jehovah's Witness   . Seizures (HCC)    last one 2 years ago    Patient Active Problem List   Diagnosis Date Noted  . Normal labor and delivery 04/17/2016  . IDA (iron deficiency anemia) 04/17/2016  . SVD (spontaneous vaginal delivery) 04/17/2016  . Postpartum care following vaginal delivery (3/13) 04/17/2016  . Seizure (HCC) 08/16/2014  . Chronic back pain 08/16/2014    Past Surgical History:  Procedure Laterality Date  . DENTAL SURGERY      OB History    Gravida Para Term Preterm AB Living   2 2 2  0 0 2   SAB TAB Ectopic Multiple Live Births   0 0 0 0 2       Home Medications    Prior to Admission medications   Medication Sig Start Date End Date Taking? Authorizing Provider  acetaminophen (TYLENOL) 325 MG tablet Take 2 tablets (650 mg total) by mouth every 4 (four) hours as needed (for pain scale < 4). 04/18/16   Shea EvansMody, Vaishali, MD  famotidine (PEPCID) 40 MG tablet Take 1 tablet (40 mg total) by mouth daily. 09/20/15   Armando ReichertHogan, Heather D, CNM  ibuprofen (ADVIL,MOTRIN) 600 MG tablet  Take 1 tablet (600 mg total) by mouth every 6 (six) hours. 04/18/16   Shea EvansMody, Vaishali, MD  iron polysaccharides (NIFEREX) 150 MG capsule Take 1 capsule (150 mg total) by mouth 2 (two) times daily. 04/18/16   Shea EvansMody, Vaishali, MD  ofloxacin (FLOXIN) 0.3 % OTIC solution Place 5 drops into the left ear 2 (two) times daily for 7 days. 04/12/17 04/19/17  Michela PitcherFawze, Keymari Sato A, PA-C  oseltamivir (TAMIFLU) 75 MG capsule Take 1 capsule (75 mg total) by mouth every 12 (twelve) hours. 03/03/17   Hedges, Tinnie GensJeffrey, PA-C  Prenatal Vit-Fe Fumarate-FA (PRENATAL MULTIVITAMIN) TABS tablet Take 1 tablet by mouth daily at 12 noon.    [provider]    Family History Family History  Problem Relation Age of Onset  . Asthma Mother   . Hypertension Mother   . Other Mother        woke up with anesthesia  . Hypertension Sister   . Hypertension Maternal Grandmother     Social History Social History   Tobacco Use  . Smoking status: Never Smoker  . Smokeless tobacco: Never Used  Substance Use Topics  . Alcohol use: No  . Drug use: Yes    Frequency: 2.0 times per week  Types: Marijuana     Allergies   Morphine and related   Review of Systems Review of Systems  Constitutional: Negative for chills and fever.  HENT: Positive for ear pain. Negative for ear discharge.      Physical Exam Updated Vital Signs BP 126/80 (BP Location: Left Arm)   Pulse 98   Temp 98 F (36.7 C) (Oral)   Resp 18   LMP 04/02/2017 (Approximate)   SpO2 100%   Physical Exam  Constitutional: She appears well-developed and well-nourished. No distress.  Crying, uncomfortable in appearance, rocking back and forth clutching left ear  HENT:  Head: Normocephalic and atraumatic.  Right TM without erythema or bulging. No canal edema or mastoid tenderness. Left ear with insect visualized in the canal.  Unable to visualize TM as a result.  Eyes: Conjunctivae are normal. Right eye exhibits no discharge. Left eye exhibits no discharge.    Neck: No JVD present. No tracheal deviation present.  Cardiovascular: Normal rate.  Pulmonary/Chest: Effort normal.  Abdominal: She exhibits no distension.  Musculoskeletal: She exhibits no edema.  Neurological: She is alert.  Skin: Skin is warm and dry. No erythema.  Psychiatric: She has a normal mood and affect. Her behavior is normal.  Nursing note and vitals reviewed.    ED Treatments / Results  Labs (all labs ordered are listed, but only abnormal results are displayed) Labs Reviewed - No data to display  EKG  EKG Interpretation None       Radiology No results found.  Procedures .Foreign Body Removal Date/Time: 04/12/2017 8:04 AM Performed by: Jeanie Sewer, PA-C Authorized by: Jeanie Sewer, PA-C  Consent: Verbal consent obtained. Consent given by: patient Patient understanding: patient states understanding of the procedure being performed Patient consent: the patient's understanding of the procedure matches consent given Imaging studies: imaging studies not available Patient identity confirmed: verbally with patient and arm band Body area: ear Location details: left ear  Anesthesia: Local Anesthetic: lidocaine 1% without epinephrine Anesthetic total: 1 mL  Sedation: Patient sedated: no  Patient restrained: no Patient cooperative: yes Localization method: ENT speculum Removal mechanism: alligator forceps Complexity: simple 1 objects recovered. Objects recovered: insect Post-procedure assessment: foreign body removed Patient tolerance: Patient tolerated the procedure well with no immediate complications   (including critical care time)  Medications Ordered in ED Medications  lidocaine (PF) (XYLOCAINE) 1 % injection 2 mL (not administered)     Initial Impression / Assessment and Plan / ED Course  I have reviewed the triage vital signs and the nursing notes.  Pertinent labs & imaging results that were available during my care of the patient were  reviewed by me and considered in my medical decision making (see chart for details).     Patient with insect in left ear canal.  Afebrile, vital signs are stable.  She is uncomfortable but nontoxic in appearance.  Insect was suffocated with lidocaine 1% without epinephrine applied to the ear canal and alligator forceps were used to remove the insect.  The ear was then irrigated with normal saline.  On revisualization, there is irritation and excoriation to the ear canal as well as excoriation of the TM with no rupture.  Will discharge with ofloxacin drops.  Recommend follow-up with primary care physician if symptoms persist.  Discussed indications for return to the ED. Pt verbalized understanding of and agreement with plan and is safe for discharge home at this time. Patient has no complaints prior to discharge.  Final Clinical Impressions(s) /  ED Diagnoses   Final diagnoses:  Foreign body of left ear, initial encounter    ED Discharge Orders        Ordered    ofloxacin (FLOXIN) 0.3 % OTIC solution  2 times daily     04/12/17 0810    She states that her father tried to flush out the insect with water but was unable to.  Jeanie Sewer, PA-C 04/12/17 1610  Donnetta Hutching, MD 04/13/17 1116

## 2017-12-19 ENCOUNTER — Encounter (HOSPITAL_COMMUNITY): Payer: Self-pay

## 2017-12-19 ENCOUNTER — Ambulatory Visit (HOSPITAL_COMMUNITY)
Admission: EM | Admit: 2017-12-19 | Discharge: 2017-12-19 | Disposition: A | Discharge status: Home / Self Care | Payer: Medicaid Other | Attending: Family Medicine | Admitting: Family Medicine

## 2017-12-19 ENCOUNTER — Other Ambulatory Visit: Payer: Self-pay

## 2017-12-19 DIAGNOSIS — J020 Streptococcal pharyngitis: Secondary | ICD-10-CM

## 2017-12-19 LAB — POCT RAPID STREP A: Streptococcus, Group A Screen (Direct): POSITIVE — AB

## 2017-12-19 MED ORDER — AMOXICILLIN 500 MG PO CAPS: 500.0000 mg | ORAL_CAPSULE | Freq: Two times a day (BID) | ORAL | 0 refills | Status: AC

## 2017-12-19 NOTE — ED Provider Notes (Signed)
[facility]    CSN: [id number] Arrival date & time: 12/19/17  4403     History   Chief Complaint Chief Complaint  Patient presents with  . Sore Throat    HPI Rebecca Cervantes is a 22 y.o. female.   Rebecca Cervantes presents with complaints of sore throat for the past 4-5 days. States her throat feels "sore" rather than scratchy. Worse with eating and drinking. Has some nasal drainage. No body aches. Pain even with speaking. Only occasional cough. Slight ear pain which comes and goes. Headache, ibuprofen has helped with this. No fevers. No gi/gu complaints. No known ill contacts. Throat spray temporarily helped. No rash. Hx of anemia, allergies, migraines, seizures.     ROS per HPI.      Past Medical History:  Diagnosis Date  . Anemia   . Chronic abdominal pain   . Chronic back pain   . Environmental allergies   . Headache(784.0)    Migraines  . Migraine   . Refusal of blood transfusions as patient is Jehovah's Witness   . Seizures (HCC)    last one 2 years ago    Patient Active Problem List   Diagnosis Date Noted  . Normal labor and delivery 04/17/2016  . IDA (iron deficiency anemia) 04/17/2016  . SVD (spontaneous vaginal delivery) 04/17/2016  . Postpartum care following vaginal delivery (3/13) 04/17/2016  . Seizure (HCC) 08/16/2014  . Chronic back pain 08/16/2014    Past Surgical History:  Procedure Laterality Date  . DENTAL SURGERY      OB History    Gravida  2   Para  2   Term  2   Preterm  0   AB  0   Living  2     SAB  0   TAB  0   Ectopic  0   Multiple  0   Live Births  2            Home Medications    Prior to Admission medications   Medication Sig Start Date End Date Taking? Authorizing Provider  acetaminophen (TYLENOL) 325 MG tablet Take 2 tablets (650 mg total) by mouth every 4 (four) hours as needed (for pain scale < 4). 04/18/16   Shea Evans, MD  amoxicillin (AMOXIL) 500 MG capsule Take 1 capsule (500 mg  total) by mouth 2 (two) times daily for 10 days. 12/19/17 12/29/17  Georgetta Haber, NP  famotidine (PEPCID) 40 MG tablet Take 1 tablet (40 mg total) by mouth daily. 09/20/15   Armando Reichert, CNM  ibuprofen (ADVIL,MOTRIN) 600 MG tablet Take 1 tablet (600 mg total) by mouth every 6 (six) hours. 04/18/16   Shea Evans, MD  iron polysaccharides (NIFEREX) 150 MG capsule Take 1 capsule (150 mg total) by mouth 2 (two) times daily. 04/18/16   Shea Evans, MD  oseltamivir (TAMIFLU) 75 MG capsule Take 1 capsule (75 mg total) by mouth every 12 (twelve) hours. 03/03/17   Hedges, Tinnie Gens, PA-C  Prenatal Vit-Fe Fumarate-FA (PRENATAL MULTIVITAMIN) TABS tablet Take 1 tablet by mouth daily at 12 noon.    [provider]    Family History Family History  Problem Relation Age of Onset  . Asthma Mother   . Hypertension Mother   . Other Mother        woke up with anesthesia  . Hypertension Sister   . Hypertension Maternal Grandmother     Social History Social History   Tobacco Use  . Smoking  status: Never Smoker  . Smokeless tobacco: Never Used  Substance Use Topics  . Alcohol use: No  . Drug use: Yes    Frequency: 2.0 times per week    Types: Marijuana     Allergies   Morphine and related   Review of Systems Review of Systems   Physical Exam Triage Vital Signs ED Triage Vitals  Enc Vitals Group     BP 12/19/17 0943 113/83     Pulse --      Resp 12/19/17 0943 16     Temp 12/19/17 0943 98.9 F (37.2 C)     Temp Source 12/19/17 0943 Oral     SpO2 12/19/17 0943 100 %     Weight 12/19/17 0945 98 lb (44.5 kg)     Height --      Head Circumference --      Peak Flow --      Pain Score 12/19/17 0944 7     Pain Loc --      Pain Edu? --      Excl. in GC? --    No data found.  Updated Vital Signs BP 113/83 (BP Location: Right Arm)   Temp 98.9 F (37.2 C) (Oral)   Resp 16   Wt 98 lb (44.5 kg)   LMP 12/09/2017   SpO2 100%   BMI 18.52 kg/m    Physical Exam    Constitutional: She is oriented to person, place, and time. She appears well-developed and well-nourished. No distress.  HENT:  Head: Normocephalic and atraumatic.  Right Ear: Tympanic membrane, external ear and ear canal normal.  Left Ear: Tympanic membrane, external ear and ear canal normal.  Nose: Nose normal.  Mouth/Throat: Uvula is midline and mucous membranes are normal. Posterior oropharyngeal erythema present. Tonsils are 1+ on the right. No tonsillar exudate.  Eyes: Pupils are equal, round, and reactive to light. Conjunctivae and EOM are normal.  Cardiovascular: Normal rate, regular rhythm and normal heart sounds.  Pulmonary/Chest: Effort normal and breath sounds normal.  Lymphadenopathy:    She has no cervical adenopathy.  Neurological: She is alert and oriented to person, place, and time.  Skin: Skin is warm and dry.     UC Treatments / Results  Labs (all labs ordered are listed, but only abnormal results are displayed) Labs Reviewed  POCT RAPID STREP A - Abnormal; Notable for the following components:      Result Value   Streptococcus, Group A Screen (Direct) POSITIVE (*)    All other components within normal limits    EKG None  Radiology No results found.  Procedures Procedures (including critical care time)  Medications Ordered in UC Medications - No data to display  Initial Impression / Assessment and Plan / UC Course  I have reviewed the triage vital signs and the nursing notes.  Pertinent labs & imaging results that were available during my care of the patient were reviewed by me and considered in my medical decision making (see chart for details).     Tonsils without swelling or exudate, no palpable adenopathy. Positive rapid strep. Course of antibiotics provided. Return precautions provided. If symptoms worsen or do not improve in the next week to return to be seen or to follow up with PCP.  Patient verbalized understanding and agreeable to plan.    Final Clinical Impressions(s) / UC Diagnoses   Final diagnoses:  Strep pharyngitis     Discharge Instructions     You are positive today for  strep throat.  Complete course of antibiotics.  Tylenol and/or ibuprofen as needed for pain or fevers.  Considered contagious for 24 hours after antibiotics have been sent.  Change out toothbrush in 24 hours.  If symptoms worsen or do not improve in the next week to return to be seen or to follow up with your PCP.     ED Prescriptions    Medication Sig Dispense Auth. Provider   amoxicillin (AMOXIL) 500 MG capsule Take 1 capsule (500 mg total) by mouth 2 (two) times daily for 10 days. 20 capsule Georgetta HaberBurky, Mikel Hardgrove B, NP     Controlled Substance Prescriptions [place] Controlled Substance Registry consulted? Not Applicable   Georgetta HaberBurky, Edris Schneck B, NP 12/19/17 1044

## 2017-12-19 NOTE — Discharge Instructions (Signed)
You are positive today for strep throat.  Complete course of antibiotics.  Tylenol and/or ibuprofen as needed for pain or fevers.  Considered contagious for 24 hours after antibiotics have been sent.  Change out toothbrush in 24 hours.  If symptoms worsen or do not improve in the next week to return to be seen or to follow up with your PCP.

## 2017-12-19 NOTE — ED Triage Notes (Signed)
Pt c/o sore throat X4 days  

## 2018-01-01 ENCOUNTER — Ambulatory Visit (INDEPENDENT_AMBULATORY_CARE_PROVIDER_SITE_OTHER): Payer: Medicaid Other | Admitting: Obstetrics and Gynecology

## 2018-01-01 ENCOUNTER — Encounter: Payer: Self-pay | Admitting: Obstetrics and Gynecology

## 2018-01-01 VITALS — BP 113/74 | HR 74 | Wt 99.8 lb

## 2018-01-01 DIAGNOSIS — Z3009 Encounter for other general counseling and advice on contraception: Secondary | ICD-10-CM

## 2018-01-01 DIAGNOSIS — Z3202 Encounter for pregnancy test, result negative: Secondary | ICD-10-CM | POA: Diagnosis not present

## 2018-01-01 DIAGNOSIS — Z803 Family history of malignant neoplasm of breast: Secondary | ICD-10-CM | POA: Insufficient documentation

## 2018-01-01 DIAGNOSIS — Z124 Encounter for screening for malignant neoplasm of cervix: Secondary | ICD-10-CM | POA: Insufficient documentation

## 2018-01-01 LAB — POCT PREGNANCY, URINE: PREG TEST UR: NEGATIVE

## 2018-01-01 MED ORDER — NORETHINDRONE 0.35 MG PO TABS: 1.0000 | ORAL_TABLET | Freq: Every day | ORAL | 11 refills | Status: DC

## 2018-01-01 NOTE — Progress Notes (Signed)
GYNECOLOGY ENCOUNTER NOTE  Subjective:   Rebecca Cervantes is a 22 y.o. [id number] female here for birth control.  Current complaints: None.   Denies abnormal vaginal bleeding, discharge, pelvic pain, problems with intercourse or other gynecologic concerns.    Gynecologic History Patient's last menstrual period was 12/05/2017 (exact date). Contraception: oral progesterone-only contraceptive Last Pap: Patient does not recall if she has ever had a pap, pap in system says abnormal pap in 2011.   Obstetric History OB History  Gravida Para Term Preterm AB Living  2 2 2  0 0 2  SAB TAB Ectopic Multiple Live Births  0 0 0 0 2    # Outcome Date GA Lbr Len/2nd Weight Sex Delivery Anes PTL Lv  2 Term 04/17/16 [redacted]w[redacted]d 02:54 / 01:02 6 lb 15.8 oz (3.17 kg) F Vag-Spont EPI  LIV     Birth Comments: wnl  1 Term 01/14/12 [redacted]w[redacted]d 12:04 / 05:53 7 lb 13.9 oz (3.569 kg) M Vag-Spont EPI  LIV     Birth Comments: No problems at birth    Past Medical History:  Diagnosis Date  . Anemia   . Chronic abdominal pain   . Chronic back pain   . Environmental allergies   . Headache(784.0)    Migraines  . Migraine   . Refusal of blood transfusions as patient is Jehovah's Witness   . Seizures (HCC)    last one 2 years ago    Past Surgical History:  Procedure Laterality Date  . DENTAL SURGERY      Current Outpatient Medications on File Prior to Visit  Medication Sig Dispense Refill  . acetaminophen (TYLENOL) 325 MG tablet Take 2 tablets (650 mg total) by mouth every 4 (four) hours as needed (for pain scale < 4). 30 tablet 0  . ibuprofen (ADVIL,MOTRIN) 600 MG tablet Take 1 tablet (600 mg total) by mouth every 6 (six) hours. 30 tablet 0   No current facility-administered medications on file prior to visit.     Allergies  Allergen Reactions  . Morphine And Related Hives    Social History:  reports that she has never smoked. She has never used smokeless tobacco. She reports that she has current or past  drug history. Drug: Marijuana. Frequency: 2.00 times per week. She reports that she does not drink alcohol.  Family History  Problem Relation Age of Onset  . Asthma Mother   . Hypertension Mother   . Other Mother        woke up with anesthesia  . Hypertension Sister   . Hypertension Maternal Grandmother     The following portions of the patient's history were reviewed and updated as appropriate: allergies, current medications, past family history, past medical history, past social history, past surgical history and problem list.  Review of Systems Pertinent items noted in HPI and remainder of comprehensive ROS otherwise negative.   Objective:  BP 113/74   Pulse 74   Wt 99 lb 12.8 oz (45.3 kg)   LMP 12/05/2017 (Exact Date)   BMI 18.86 kg/m  CONSTITUTIONAL: Well-developed, well-nourished female in no acute distress.  HENT:  Normocephalic, atraumatic, External right and left ear normal. Oropharynx is clear and moist EYES: Conjunctivae and EOM are normal. Pupils are equal, round, and reactive to light. No scleral icterus.  SKIN: Skin is warm and dry. No rash noted. Not diaphoretic. No erythema. No pallor. MUSCULOSKELETAL: Normal range of motion. No tenderness.  No cyanosis, clubbing, or edema.  2+ distal pulses.  NEUROLOGIC: Alert and oriented to person, place, and time. Normal reflexes, muscle tone   Assessment and Plan:    1. Counseling for birth control, oral contraceptives  Rx progestin only pills per patient request.  Patient to schedule annual ASAP for pap.   2. Family history of breast cancer  Patient to schedule annual. No problems or concerns today.    Rasch, Harolyn RutherfordJennifer I, NP [facility], [facility]

## 2018-02-06 ENCOUNTER — Ambulatory Visit (INDEPENDENT_AMBULATORY_CARE_PROVIDER_SITE_OTHER): Payer: Medicaid Other | Admitting: Obstetrics and Gynecology

## 2018-02-06 ENCOUNTER — Other Ambulatory Visit (HOSPITAL_COMMUNITY)
Admission: RE | Admit: 2018-02-06 | Discharge: 2018-02-06 | Disposition: A | Discharge status: Home / Self Care | Payer: Medicaid Other | Source: Ambulatory Visit | Attending: Obstetrics and Gynecology | Admitting: Obstetrics and Gynecology

## 2018-02-06 ENCOUNTER — Encounter: Payer: Self-pay | Admitting: Obstetrics and Gynecology

## 2018-02-06 VITALS — BP 112/72 | HR 109 | Wt 98.4 lb

## 2018-02-06 DIAGNOSIS — Z124 Encounter for screening for malignant neoplasm of cervix: Secondary | ICD-10-CM | POA: Insufficient documentation

## 2018-02-06 DIAGNOSIS — Z23 Encounter for immunization: Secondary | ICD-10-CM | POA: Diagnosis not present

## 2018-02-06 NOTE — Patient Instructions (Signed)
Smoking and Musculoskeletal Health Smoking is bad for your health. Most people know that smoking causes lung disease, heart disease, and cancer. But people may not realize that it also affects their bones, muscles, and joints (musculoskeletal system). When you smoke, the effects on your lungs and heart result in less oxygen for your musculoskeletal system. This can lead to poor bone and joint health. How can smoking affect my musculoskeletal health? Smoking can:  Increase your risk of having weak, thin bones (osteoporosis). Elderly smokers are at higher risk for bone fractures related to osteoporosis.  Decrease the ability of bone-forming cells to make and replace bone (in addition to reducing oxygen and blood flow).  Reduce your body's ability to absorb calcium from your diet. Less calcium means weaker bones.  Interfere with the breakdown of the female hormone estrogen. Smoking lowers estrogen, which is a hormone that helps keep bones strong. Women who smoke may have earlier menopause. Menopause is a risk factor for osteoporosis.  Weaken the tissues that attach bones to muscles (tendons). This can lead to shoulder, back, and other joint injuries.  Increase your risk of rheumatoid arthritis or make the condition worse if you already have it.  Slow down healing and increase your risk of infection and other complications if you have a bone fracture or surgery that involves your musculoskeletal system.  Make you get out of breath easily. This can keep you from getting the exercise you need to keep your bones and joints healthy.  Decrease your appetite and body mass. You may lose weight and muscle strength. This can put you at higher risk for muscle injury, joint injury, and broken bones. What actions can I take to prevent musculoskeletal problems? Quit smoking      Do not start smoking. Quit if you already do. Even stopping later in life can improve musculoskeletal health.  Do not use any  products that contain nicotine or tobacco. Do not replace cigarette smoking with e-cigarettes. The safety of e-cigarettes is not known, and some may contain harmful chemicals.  Make a plan to quit smoking and commit to it. Look for programs to help you, and ask your health care provider for recommendations and ideas.  Talk with your health care provider about using nicotine replacement medicines to help you quit, such as gum, lozenges, patches, sprays, or pills. Make other lifestyle changes   Eat a healthy diet that includes calcium and vitamin D. These nutrients are important for bone health. ? Calcium is found in dairy foods and green leafy vegetables. ? Vitamin D is found in eggs, fish, and liver. ? Many foods also have vitamin D and calcium added to them (are fortified). ? Ask your health care provider if you would benefit from taking a supplement.  Get out in the sunshine for a short time every day. This increases production of vitamin D.  Get 30 minutes of exercise at least 5 days a week. Weight-bearing and strength exercises are best for musculoskeletal health. Ask your health care provider what type of exercise is safe for you.  Do not drink alcohol if: ? Your health care provider tells you not to drink. ? You are pregnant, may be pregnant, or are planning to become pregnant.  If you drink alcohol, limit how much you have: ? 0-1 drink a day for women. ? 0-2 drinks a day for men.  Be aware of how much alcohol is in your drink. In the [place] one drink equals one 12 oz bottle   of beer (355 mL), one 5 oz glass of wine (148 mL), or one 1 oz glass of hard liquor (44 mL). Where to find more information You may find more information about smoking, musculoskeletal health, and quitting smoking from:  American Academy of Orthopaedic Surgeons: orthoinfo.aaos.org  National Institutes of Health, Osteoporosis and Related Bone Diseases National Resource Center: bones.nih.gov  HelpGuide.org:  helpguide.org  Smokefree.gov: smokefree.gov  American Lung Association: lung.org Contact a health care provider if:  You need help to quit smoking. Summary  When you smoke, the effects on your lungs and heart result in less oxygen for your musculoskeletal system.  Even stopping smoking later in life can improve musculoskeletal health.  Do not use any products that contain nicotine or tobacco, such as cigarettes and e-cigarettes.  If you need help quitting, ask your health care provider. This information is not intended to replace advice given to you by your health care provider. Make sure you discuss any questions you have with your health care provider. Document Released: 05/20/2017 Document Revised: 05/20/2017 Document Reviewed: 05/20/2017 Elsevier Interactive Patient Education  2019 [employer]  

## 2018-02-06 NOTE — Progress Notes (Signed)
GYNECOLOGY ANNUAL PREVENTATIVE CARE ENCOUNTER NOTE  Subjective:   Rebecca Cervantes is a 23 y.o. G35P2002 female here for a routine annual gynecologic exam.  Current complaints: Menstrual cycle is 2 days late.   Denies abnormal vaginal bleeding, discharge, pelvic pain, problems with intercourse or other gynecologic concerns.    Gynecologic History Patient's last menstrual period was 01/04/2018. Contraception: oral progesterone-only contraceptive Last Pap: 2011. Results were: abnormal  Last mammogram: NA  Obstetric History OB History  Gravida Para Term Preterm AB Living  2 2 2  0 0 2  SAB TAB Ectopic Multiple Live Births  0 0 0 0 2    # Outcome Date GA Lbr Len/2nd Weight Sex Delivery Anes PTL Lv  2 Term 04/17/16 [redacted]w[redacted]d 02:54 / 01:02 6 lb 15.8 oz (3.17 kg) F Vag-Spont EPI  LIV     Birth Comments: wnl  1 Term 01/14/12 [redacted]w[redacted]d 12:04 / 05:53 7 lb 13.9 oz (3.569 kg) M Vag-Spont EPI  LIV     Birth Comments: No problems at birth    Past Medical History:  Diagnosis Date  . Anemia   . Chronic abdominal pain   . Chronic back pain   . Environmental allergies   . Headache(784.0)    Migraines  . Migraine   . Refusal of blood transfusions as patient is Jehovah's Witness   . Seizures (HCC)    last one 2 years ago    Past Surgical History:  Procedure Laterality Date  . DENTAL SURGERY      Current Outpatient Medications on File Prior to Visit  Medication Sig Dispense Refill  . acetaminophen (TYLENOL) 325 MG tablet Take 2 tablets (650 mg total) by mouth every 4 (four) hours as needed (for pain scale < 4). 30 tablet 0  . ibuprofen (ADVIL,MOTRIN) 600 MG tablet Take 1 tablet (600 mg total) by mouth every 6 (six) hours. 30 tablet 0  . norethindrone (MICRONOR,CAMILA,ERRIN) 0.35 MG tablet Take 1 tablet (0.35 mg total) by mouth daily. 1 Package 11   No current facility-administered medications on file prior to visit.     Allergies  Allergen Reactions  . Morphine And Related Hives     Social History:  reports that she has never smoked. She has never used smokeless tobacco. She reports current drug use. Frequency: 2.00 times per week. Drug: Marijuana. She reports that she does not drink alcohol.  Family History  Problem Relation Age of Onset  . Asthma Mother   . Hypertension Mother   . Other Mother        woke up with anesthesia  . Hypertension Sister   . Hypertension Maternal Grandmother     The following portions of the patient's history were reviewed and updated as appropriate: allergies, current medications, past family history, past medical history, past social history, past surgical history and problem list.  Review of Systems Pertinent items noted in HPI and remainder of comprehensive ROS otherwise negative.   Objective:  BP 112/72   Pulse (!) 109   Wt 98 lb 6.4 oz (44.6 kg)   LMP 01/04/2018   Breastfeeding Yes   BMI 18.59 kg/m  CONSTITUTIONAL: Well-developed, well-nourished female in no acute distress.  HENT:  Normocephalic, atraumatic, External right and left ear normal. Oropharynx is clear and moist EYES: Conjunctivae and EOM are normal. Pupils are equal, round, and reactive to light. No scleral icterus.  NECK: Normal range of motion, supple, no masses.  Normal thyroid.  SKIN: Skin is warm and dry.  No rash noted. Not diaphoretic. No erythema. No pallor. MUSCULOSKELETAL: Normal range of motion. No tenderness.  No cyanosis, clubbing, or edema.  2+ distal pulses. NEUROLOGIC: Alert and oriented to person, place, and time. Normal reflexes, muscle tone coordination. No cranial nerve deficit noted. PSYCHIATRIC: Normal mood and affect. Normal behavior. Normal judgment and thought content. CARDIOVASCULAR: Normal heart rate noted, regular rhythm RESPIRATORY: Clear to auscultation bilaterally. Effort and breath sounds normal, no problems with respiration noted. BREASTS: Symmetric in size. No masses, skin changes, nipple drainage, or  lymphadenopathy. ABDOMEN: Soft, normal bowel sounds, no distention noted.  No tenderness, rebound or guarding.  PELVIC: Normal appearing external genitalia; normal appearing vaginal mucosa and cervix.  No abnormal discharge noted.  Pap smear obtained.  Normal uterine size, no other palpable masses, no uterine or adnexal tenderness.    Assessment and Plan:   1. Routine Papanicolaou smear  - Cytology - PAP( [place]) - Thyroid Panel With TSH - Urine Microscopic Only - urine pregnancy test   Will follow up results of pap smear and manage accordingly. Routine preventative health maintenance measures emphasized. Please refer to After Visit Summary for other counseling recommendations.    Klever Twyford, Harolyn RutherfordJennifer I, NP [facility], [facility]

## 2018-02-07 LAB — URINALYSIS, MICROSCOPIC ONLY
BACTERIA UA: NONE SEEN
CASTS: NONE SEEN /LPF

## 2018-02-07 LAB — THYROID PANEL WITH TSH
Free Thyroxine Index: 1.8 (ref 1.2–4.9)
T3 Uptake Ratio: 27 % (ref 24–39)
T4, Total: 6.5 ug/dL (ref 4.5–12.0)
TSH: 0.544 u[IU]/mL (ref 0.450–4.500)

## 2018-02-10 LAB — CYTOLOGY - PAP
ADEQUACY: ABSENT
Bacterial vaginitis: POSITIVE — AB
CANDIDA VAGINITIS: NEGATIVE
CHLAMYDIA, DNA PROBE: NEGATIVE
DIAGNOSIS: NEGATIVE
Neisseria Gonorrhea: NEGATIVE
Trichomonas: NEGATIVE

## 2018-02-12 ENCOUNTER — Telehealth: Payer: Self-pay | Admitting: *Deleted

## 2018-02-12 MED ORDER — METRONIDAZOLE 500 MG PO TABS: 500.0000 mg | ORAL_TABLET | Freq: Two times a day (BID) | ORAL | 0 refills | Status: DC

## 2018-02-12 NOTE — Telephone Encounter (Signed)
Called pt and informed her of negative Pap test but did show +BV.  Pt states she is having some vaginal itching and irritation and would like Rx for Metronidazole sent to pharmacy. Pt also states she was supposed to have pregnancy test performed on the same Oswin Johal (1/2) and would like the results. I advised that I do not see pregnancy test was performed. Pt states her LMP was 01/04/18. She had started birth control after that period and thought the missed period could be due to the birth control. I advised pt that she may come to office for UPT if desired. Pt voiced understanding of all information and instructions given.

## 2018-06-23 ENCOUNTER — Emergency Department (HOSPITAL_COMMUNITY)
Admission: EM | Admit: 2018-06-23 | Discharge: 2018-06-23 | Disposition: A | Discharge status: Home / Self Care | Payer: Medicaid Other | Attending: Emergency Medicine | Admitting: Emergency Medicine

## 2018-06-23 ENCOUNTER — Other Ambulatory Visit: Payer: Self-pay

## 2018-06-23 ENCOUNTER — Encounter (HOSPITAL_COMMUNITY): Payer: Self-pay | Admitting: Emergency Medicine

## 2018-06-23 DIAGNOSIS — R1030 Lower abdominal pain, unspecified: Secondary | ICD-10-CM | POA: Diagnosis present

## 2018-06-23 DIAGNOSIS — N760 Acute vaginitis: Secondary | ICD-10-CM | POA: Insufficient documentation

## 2018-06-23 DIAGNOSIS — B9689 Other specified bacterial agents as the cause of diseases classified elsewhere: Secondary | ICD-10-CM | POA: Insufficient documentation

## 2018-06-23 DIAGNOSIS — Z79899 Other long term (current) drug therapy: Secondary | ICD-10-CM | POA: Insufficient documentation

## 2018-06-23 LAB — WET PREP, GENITAL
Sperm: NONE SEEN
Trich, Wet Prep: NONE SEEN
Yeast Wet Prep HPF POC: NONE SEEN

## 2018-06-23 LAB — COMPREHENSIVE METABOLIC PANEL
ALT: 14 U/L (ref 0–44)
AST: 45 U/L — ABNORMAL HIGH (ref 15–41)
Albumin: 4.5 g/dL (ref 3.5–5.0)
Alkaline Phosphatase: 42 U/L (ref 38–126)
Anion gap: 12 (ref 5–15)
BUN: 10 mg/dL (ref 6–20)
CO2: 20 mmol/L — ABNORMAL LOW (ref 22–32)
Calcium: 9 mg/dL (ref 8.9–10.3)
Chloride: 103 mmol/L (ref 98–111)
Creatinine, Ser: 0.98 mg/dL (ref 0.44–1.00)
GFR calc Af Amer: >60 mL/min (ref >60)
GFR calc non Af Amer: >60 mL/min (ref >60)
Glucose, Bld: 90 mg/dL (ref 70–99)
Potassium: 5.5 mmol/L — ABNORMAL HIGH (ref 3.5–5.1)
Sodium: 135 mmol/L (ref 135–145)
Total Bilirubin: 1.5 mg/dL — ABNORMAL HIGH (ref 0.3–1.2)
Total Protein: 7.1 g/dL (ref 6.5–8.1)

## 2018-06-23 LAB — CBC WITH DIFFERENTIAL/PLATELET
Abs Immature Granulocytes: 0.03 10*3/uL (ref 0.00–0.07)
Basophils Absolute: 0 10*3/uL (ref 0.0–0.1)
Basophils Relative: 0 %
Eosinophils Absolute: 0.1 10*3/uL (ref 0.0–0.5)
Eosinophils Relative: 1 %
HCT: 41.7 % (ref 36.0–46.0)
Hemoglobin: 14.1 g/dL (ref 12.0–15.0)
Immature Granulocytes: 0 %
Lymphocytes Relative: 25 %
Lymphs Abs: 2.5 10*3/uL (ref 0.7–4.0)
MCH: 35.6 pg — ABNORMAL HIGH (ref 26.0–34.0)
MCHC: 33.8 g/dL (ref 30.0–36.0)
MCV: 105.3 fL — ABNORMAL HIGH (ref 80.0–100.0)
Monocytes Absolute: 0.5 10*3/uL (ref 0.1–1.0)
Monocytes Relative: 5 %
Neutro Abs: 7 10*3/uL (ref 1.7–7.7)
Neutrophils Relative %: 69 %
Platelets: 229 10*3/uL (ref 150–400)
RBC: 3.96 MIL/uL (ref 3.87–5.11)
RDW: 11.6 % (ref 11.5–15.5)
WBC: 10.2 10*3/uL (ref 4.0–10.5)
nRBC: 0 % (ref 0.0–0.2)

## 2018-06-23 LAB — URINALYSIS, ROUTINE W REFLEX MICROSCOPIC
Bilirubin Urine: NEGATIVE
Glucose, UA: NEGATIVE mg/dL
Hgb urine dipstick: NEGATIVE
Ketones, ur: NEGATIVE mg/dL
Leukocytes,Ua: NEGATIVE
Nitrite: NEGATIVE
Protein, ur: NEGATIVE mg/dL
Specific Gravity, Urine: 1.018 (ref 1.005–1.030)
pH: 6 (ref 5.0–8.0)

## 2018-06-23 MED ORDER — ALUM & MAG HYDROXIDE-SIMETH 200-200-20 MG/5ML PO SUSP
30.0000 mL | Freq: Once | ORAL | Status: AC
  Administered 2018-06-23: 30 mL via ORAL
  Filled 2018-06-23: qty 30

## 2018-06-23 MED ORDER — ACETAMINOPHEN 325 MG PO TABS
650.0000 mg | ORAL_TABLET | Freq: Once | ORAL | Status: AC
  Administered 2018-06-23: 650 mg via ORAL
  Filled 2018-06-23: qty 2

## 2018-06-23 MED ORDER — OXYCODONE-ACETAMINOPHEN 5-325 MG PO TABS
1.0000 | ORAL_TABLET | Freq: Once | ORAL | Status: AC
  Administered 2018-06-23: 1 via ORAL
  Filled 2018-06-23: qty 1

## 2018-06-23 MED ORDER — DICYCLOMINE HCL 10 MG PO CAPS: 10.0000 mg | ORAL_CAPSULE | Freq: Three times a day (TID) | ORAL | 0 refills | Status: DC

## 2018-06-23 MED ORDER — METRONIDAZOLE 500 MG PO TABS: 500.0000 mg | ORAL_TABLET | Freq: Two times a day (BID) | ORAL | 0 refills | Status: AC

## 2018-06-23 MED ORDER — ONDANSETRON 4 MG PO TBDP
4.0000 mg | ORAL_TABLET | Freq: Once | ORAL | Status: AC
  Administered 2018-06-23: 4 mg via ORAL
  Filled 2018-06-23: qty 1

## 2018-06-23 MED ORDER — LIDOCAINE VISCOUS HCL 2 % MT SOLN
15.0000 mL | Freq: Once | OROMUCOSAL | Status: AC
  Administered 2018-06-23: 15 mL via ORAL
  Filled 2018-06-23: qty 15

## 2018-06-23 MED ORDER — DICYCLOMINE HCL 10 MG/5ML PO SOLN
10.0000 mg | Freq: Once | ORAL | Status: AC
  Administered 2018-06-23: 10:00:00 10 mg via ORAL
  Filled 2018-06-23: qty 5

## 2018-06-23 NOTE — ED Notes (Signed)
MD at bedside. 

## 2018-06-23 NOTE — ED Provider Notes (Signed)
[facility] EMERGENCY DEPARTMENT Provider Note   CSN: [id number] Arrival date & time: 06/23/18  7867    History   Chief Complaint Chief Complaint  Patient presents with  . Abdominal Pain    HPI Rebecca Cervantes is a 23 y.o. female with chronic abdominal pain presenting to the ED with abdominal pain for the past day. She reports she was in her usual state of health when her pain started around 5 pm yesterday. She describes the pain as a sharp cramping pain in both lower quadrants and periumbilical region. She states this does not feel like her usual abdominal pain, which is usually more dull and causes bloating. She took some gas-x, ibuprofen and used a heating pad which provided minimal relief. She denies any fever, chills, n/v, diarrhea, constipation (although she only has BM every 2-3 days), difficulty urinating, blood in stool or urine, abnormal bleeding, vaginal pain or discharge. Denies any recent sick contacts or changes in diet. She states her LMP was 5/2. She recently started birth control but gets her period every ~35 days. No surgical history. She has two children and had normal deliveries with both. She states she has been worked up for her chronic abdominal pain but never been diagnosed with anything. She was told she could have Crohn's disease and needed a repeat colonoscopy but has not established with an adult GI.      HPI  Past Medical History:  Diagnosis Date  . Anemia   . Chronic abdominal pain   . Chronic back pain   . Environmental allergies   . Headache(784.0)    Migraines  . Migraine   . Refusal of blood transfusions as patient is Jehovah's Witness   . Seizures (HCC)    last one 2 years ago    Patient Active Problem List   Diagnosis Date Noted  . Routine Papanicolaou smear 01/01/2018  . Family history of breast cancer 01/01/2018  . Normal labor and delivery 04/17/2016  . IDA (iron deficiency anemia) 04/17/2016  . SVD (spontaneous  vaginal delivery) 04/17/2016  . Postpartum care following vaginal delivery (3/13) 04/17/2016  . Seizure (HCC) 08/16/2014  . Chronic back pain 08/16/2014    Past Surgical History:  Procedure Laterality Date  . DENTAL SURGERY       OB History    Gravida  2   Para  2   Term  2   Preterm  0   AB  0   Living  2     SAB  0   TAB  0   Ectopic  0   Multiple  0   Live Births  2            Home Medications    Prior to Admission medications   Medication Sig Start Date End Date Taking? Authorizing Provider  acetaminophen (TYLENOL) 325 MG tablet Take 2 tablets (650 mg total) by mouth every 4 (four) hours as needed (for pain scale < 4). 04/18/16   Shea Evans, MD  dicyclomine (BENTYL) 10 MG capsule Take 1 capsule (10 mg total) by mouth 4 (four) times daily -  before meals and at bedtime for 5 days. 06/23/18 06/28/18  Davie Sagona N, DO  ibuprofen (ADVIL,MOTRIN) 600 MG tablet Take 1 tablet (600 mg total) by mouth every 6 (six) hours. 04/18/16   Shea Evans, MD  metroNIDAZOLE (FLAGYL) 500 MG tablet Take 1 tablet (500 mg total) by mouth 2 (two) times daily for 7 days. 06/23/18  06/30/18  Holley Kocurek N, DO  norethindrone (MICRONOR,CAMILA,ERRIN) 0.35 MG tablet Take 1 tablet (0.35 mg total) by mouth daily. 01/01/18   Rasch, Harolyn Rutherford, NP    Family History Family History  Problem Relation Age of Onset  . Asthma Mother   . Hypertension Mother   . Other Mother        woke up with anesthesia  . Hypertension Sister   . Hypertension Maternal Grandmother     Social History Social History   Tobacco Use  . Smoking status: Never Smoker  . Smokeless tobacco: Never Used  Substance Use Topics  . Alcohol use: No  . Drug use: Yes    Frequency: 2.0 times per week    Types: Marijuana     Allergies   Morphine and related   Review of Systems Review of Systems  Constitutional: Negative for appetite change, chills, diaphoresis, fatigue and fever.  Respiratory: Negative  for cough, shortness of breath and wheezing.   Cardiovascular: Negative for chest pain.  Gastrointestinal: Positive for abdominal pain. Negative for abdominal distention, blood in stool, constipation, diarrhea, nausea and vomiting.  Genitourinary: Negative for decreased urine volume, difficulty urinating, dysuria, flank pain, frequency, hematuria, menstrual problem, pelvic pain, urgency, vaginal bleeding and vaginal discharge.     Physical Exam Updated Vital Signs BP 102/71   Pulse (!) 58   Temp (!) 97.5 F (36.4 C) (Oral)   Resp 16   LMP 06/07/2018 (Exact Date)   SpO2 100%   Physical Exam Constitutional:      General: She is not in acute distress.    Appearance: She is well-developed and normal weight. She is not ill-appearing.  Cardiovascular:     Rate and Rhythm: Normal rate and regular rhythm.     Heart sounds: No murmur. No friction rub. No gallop.   Pulmonary:     Effort: Pulmonary effort is normal. No respiratory distress.     Breath sounds: Normal breath sounds. No wheezing or rales.  Abdominal:     General: Abdomen is flat. Bowel sounds are normal. There is no distension.     Palpations: Abdomen is soft. There is no hepatomegaly.     Tenderness: There is abdominal tenderness in the right lower quadrant, periumbilical area, suprapubic area and left lower quadrant. There is no right CVA tenderness, left CVA tenderness, guarding or rebound. Negative signs include McBurney's sign.  Skin:    General: Skin is warm and dry.  Neurological:     Mental Status: She is alert and oriented to person, place, and time.  Psychiatric:        Mood and Affect: Mood normal.        Behavior: Behavior normal.      ED Treatments / Results  Labs (all labs ordered are listed, but only abnormal results are displayed) Labs Reviewed  WET PREP, GENITAL - Abnormal; Notable for the following components:      Result Value   Clue Cells Wet Prep HPF POC PRESENT (*)    WBC, Wet Prep HPF POC  MANY (*)    All other components within normal limits  COMPREHENSIVE METABOLIC PANEL - Abnormal; Notable for the following components:   Potassium 5.5 (*)    CO2 20 (*)    AST 45 (*)    Total Bilirubin 1.5 (*)    All other components within normal limits  CBC WITH DIFFERENTIAL/PLATELET - Abnormal; Notable for the following components:   MCV 105.3 (*)    MCH 35.6 (*)  All other components within normal limits  URINALYSIS, ROUTINE W REFLEX MICROSCOPIC  RPR  HIV ANTIBODY (ROUTINE TESTING W REFLEX)  POC URINE PREG, ED  GC/CHLAMYDIA PROBE AMP ([place]) NOT [facility]    EKG None  Radiology No results found.  Procedures Procedures (including critical care time)  Medications Ordered in ED Medications  acetaminophen (TYLENOL) tablet 650 mg (650 mg Oral Given 06/23/18 0934)  oxyCODONE-acetaminophen (PERCOCET/ROXICET) 5-325 MG per tablet 1 tablet (1 tablet Oral Given 06/23/18 0934)  dicyclomine (BENTYL) 10 MG/5ML syrup 10 mg (10 mg Oral Given 06/23/18 1019)  alum & mag hydroxide-simeth (MAALOX/MYLANTA) 200-200-20 MG/5ML suspension 30 mL (30 mLs Oral Given 06/23/18 0934)    And  lidocaine (XYLOCAINE) 2 % viscous mouth solution 15 mL (15 mLs Oral Given 06/23/18 0934)     Initial Impression / Assessment and Plan / ED Course  I have reviewed the triage vital signs and the nursing notes.  Pertinent labs & imaging results that were available during my care of the patient were reviewed by me and considered in my medical decision making (see chart for details).  Pt is a 23 yo with chronic abdominal pain presenting to the ED with lower abdominal pain for the past day. She is non-toxic appearing, no n/v, no vaginal pain, abnormal bleeding or discharge. Pain is generalized to both lower quadrants and periumbilical region, no rebound or guarding. Given her young reproductive age and location of pain will proceed with a pelvic exam.   On pelvic exam, patient did not have pain, no cervical motion  tenderness but noted cloudy white vaginal discharge. STI panel collected. Patient given a GI cocktail and pain medications as well. Wet prep shows clue cells, indicative of BV. Will discharge with metronidazole for 7 days. Will also prescribe short term course of bentyl for cramping abdominal pain.     Final Clinical Impressions(s) / ED Diagnoses   Final diagnoses:  BV (bacterial vaginosis)    ED Discharge Orders         Ordered    metroNIDAZOLE (FLAGYL) 500 MG tablet  2 times daily     06/23/18 1115    dicyclomine (BENTYL) 10 MG capsule  3 times daily before meals & bedtime     06/23/18 1115           Cian Costanzo N, DO 06/23/18 1119    Melene PlanFloyd, Dan, DO 06/23/18 1128

## 2018-06-23 NOTE — ED Triage Notes (Signed)
Patient reports new onset abdominal cramping, intermittent since yesterday afternoon. History chronic abdominal pain, but states this pain is new. Denies N/V/D or urinary symptoms. LMP 06/07/18. Last BM 2 days ago. Denies fevers/chills. No relief from Gas-X, little relief from ibuprofen around midnight, but states pain worse this morning upon waking.

## 2018-06-23 NOTE — ED Notes (Signed)
Patient verbalized understanding of discharge instructions and denies any further needs or questions at this time. VS stable. Patient ambulatory with steady gait.  

## 2018-06-23 NOTE — Discharge Instructions (Addendum)
You came to the ED with lower abdominal pain and were found to have bacterial vaginosis. Take metronidazole, 500 mg twice a day for 7 days. I have also sent in a prescription for bentyl which may help with your cramping pain, take 10 mg up to 4 times daily. I have provided the number of a GI doctor you can call to establish care with.

## 2018-06-25 LAB — GC/CHLAMYDIA PROBE AMP (~~LOC~~) NOT AT ARMC
Chlamydia: NEGATIVE
Neisseria Gonorrhea: NEGATIVE

## 2018-08-21 ENCOUNTER — Encounter (HOSPITAL_COMMUNITY): Payer: Self-pay | Admitting: Emergency Medicine

## 2018-08-21 ENCOUNTER — Emergency Department (HOSPITAL_COMMUNITY): Payer: Medicaid Other

## 2018-08-21 ENCOUNTER — Other Ambulatory Visit: Payer: Self-pay

## 2018-08-21 ENCOUNTER — Emergency Department (HOSPITAL_COMMUNITY)
Admission: EM | Admit: 2018-08-21 | Discharge: 2018-08-21 | Disposition: A | Discharge status: Home / Self Care | Payer: Medicaid Other | Attending: Emergency Medicine | Admitting: Emergency Medicine

## 2018-08-21 DIAGNOSIS — R1033 Periumbilical pain: Secondary | ICD-10-CM | POA: Diagnosis not present

## 2018-08-21 DIAGNOSIS — R1011 Right upper quadrant pain: Secondary | ICD-10-CM | POA: Insufficient documentation

## 2018-08-21 DIAGNOSIS — R1010 Upper abdominal pain, unspecified: Secondary | ICD-10-CM

## 2018-08-21 DIAGNOSIS — Z79899 Other long term (current) drug therapy: Secondary | ICD-10-CM | POA: Diagnosis not present

## 2018-08-21 DIAGNOSIS — R109 Unspecified abdominal pain: Secondary | ICD-10-CM | POA: Diagnosis not present

## 2018-08-21 LAB — COMPREHENSIVE METABOLIC PANEL
ALT: 19 U/L (ref 0–44)
AST: 33 U/L (ref 15–41)
Albumin: 4.6 g/dL (ref 3.5–5.0)
Alkaline Phosphatase: 50 U/L (ref 38–126)
Anion gap: 17 — ABNORMAL HIGH (ref 5–15)
BUN: 15 mg/dL (ref 6–20)
CO2: 19 mmol/L — ABNORMAL LOW (ref 22–32)
Calcium: 10.1 mg/dL (ref 8.9–10.3)
Chloride: 103 mmol/L (ref 98–111)
Creatinine, Ser: 1.01 mg/dL — ABNORMAL HIGH (ref 0.44–1.00)
GFR calc Af Amer: >60 mL/min (ref >60)
GFR calc non Af Amer: >60 mL/min (ref >60)
Glucose, Bld: 98 mg/dL (ref 70–99)
Potassium: 3.8 mmol/L (ref 3.5–5.1)
Sodium: 139 mmol/L (ref 135–145)
Total Bilirubin: 0.9 mg/dL (ref 0.3–1.2)
Total Protein: 8.2 g/dL — ABNORMAL HIGH (ref 6.5–8.1)

## 2018-08-21 LAB — I-STAT BETA HCG BLOOD, ED (MC, WL, AP ONLY): I-stat hCG, quantitative: <5 m[IU]/mL (ref <5)

## 2018-08-21 LAB — URINALYSIS, ROUTINE W REFLEX MICROSCOPIC
Bacteria, UA: NONE SEEN
Bilirubin Urine: NEGATIVE
Glucose, UA: NEGATIVE mg/dL
Ketones, ur: 5 mg/dL — AB
Leukocytes,Ua: NEGATIVE
Nitrite: NEGATIVE
Protein, ur: 100 mg/dL — AB
Specific Gravity, Urine: 1.033 — ABNORMAL HIGH (ref 1.005–1.030)
pH: 6 (ref 5.0–8.0)

## 2018-08-21 LAB — LIPASE, BLOOD: Lipase: 25 U/L (ref 11–51)

## 2018-08-21 LAB — CBC WITH DIFFERENTIAL/PLATELET
Abs Immature Granulocytes: 0.04 10*3/uL (ref 0.00–0.07)
Basophils Absolute: 0.1 10*3/uL (ref 0.0–0.1)
Basophils Relative: 0 %
Eosinophils Absolute: 0.1 10*3/uL (ref 0.0–0.5)
Eosinophils Relative: 0 %
HCT: 41.7 % (ref 36.0–46.0)
Hemoglobin: 14.1 g/dL (ref 12.0–15.0)
Immature Granulocytes: 0 %
Lymphocytes Relative: 14 %
Lymphs Abs: 2 10*3/uL (ref 0.7–4.0)
MCH: 35.6 pg — ABNORMAL HIGH (ref 26.0–34.0)
MCHC: 33.8 g/dL (ref 30.0–36.0)
MCV: 105.3 fL — ABNORMAL HIGH (ref 80.0–100.0)
Monocytes Absolute: 0.9 10*3/uL (ref 0.1–1.0)
Monocytes Relative: 6 %
Neutro Abs: 11.4 10*3/uL — ABNORMAL HIGH (ref 1.7–7.7)
Neutrophils Relative %: 80 %
Platelets: 204 10*3/uL (ref 150–400)
RBC: 3.96 MIL/uL (ref 3.87–5.11)
RDW: 12.1 % (ref 11.5–15.5)
WBC: 14.5 10*3/uL — ABNORMAL HIGH (ref 4.0–10.5)
nRBC: 0 % (ref 0.0–0.2)

## 2018-08-21 MED ORDER — DICYCLOMINE HCL 10 MG PO CAPS: 10.0000 mg | ORAL_CAPSULE | Freq: Three times a day (TID) | ORAL | 0 refills | Status: DC

## 2018-08-21 MED ORDER — DIPHENHYDRAMINE HCL 50 MG/ML IJ SOLN
25.0000 mg | Freq: Once | INTRAMUSCULAR | Status: AC
  Administered 2018-08-21: 25 mg via INTRAVENOUS
  Filled 2018-08-21: qty 1

## 2018-08-21 MED ORDER — HYDROMORPHONE HCL 1 MG/ML IJ SOLN
0.5000 mg | Freq: Once | INTRAMUSCULAR | Status: AC
  Administered 2018-08-21: 0.5 mg via INTRAVENOUS
  Filled 2018-08-21: qty 1

## 2018-08-21 MED ORDER — SODIUM CHLORIDE 0.9 % IV BOLUS
1000.0000 mL | Freq: Once | INTRAVENOUS | Status: AC
  Administered 2018-08-21: 1000 mL via INTRAVENOUS

## 2018-08-21 MED ORDER — ONDANSETRON HCL 4 MG/2ML IJ SOLN
4.0000 mg | Freq: Once | INTRAMUSCULAR | Status: AC
  Administered 2018-08-21: 4 mg via INTRAVENOUS
  Filled 2018-08-21: qty 2

## 2018-08-21 NOTE — ED Provider Notes (Signed)
The patient presented with abdominal pain, this is somewhat chronic, she reports that it was periumbilical, she had an ultrasound and labs, the ultrasound was totally normal, showing no signs of gallstones or wall thickening, there was no sonographic Murphy sign.  The common bile duct was not dilated.  Her blood counts were elevated at 14,500 but LFTs and lipase were normal.  On my exam the patient has no reproducible abdominal tenderness other than mild right upper quadrant tenderness but no guarding or Murphy sign.  When she is not being examined she states that she is symptom-free.  She desires to be discharged which I think is reasonable at this time as there is no surgical findings found on my exam or the work-up.  I have encouraged her to return to the emergency department should her symptoms worsen and to follow-up with her family doctor to which she agrees.   Rebecca Chapel, MD 08/21/18 [phone]

## 2018-08-21 NOTE — ED Triage Notes (Signed)
Patient with abdominal pain since yesterday.  Menses started on Tuesday but the pain is getting worse.  She states that she has urinated with no problems and she has had a recent BM.  No vomiting, but she does have some slight nausea with the cramping.  Patient denies pregnancy.

## 2018-08-21 NOTE — ED Provider Notes (Signed)
[facility] EMERGENCY DEPARTMENT Provider Note   CSN: [id number] Arrival date & time: 08/21/18  0548    History   Chief Complaint Chief Complaint  Patient presents with  . Abdominal Pain    HPI Rebecca Cervantes is a 23 y.o. female.   The history is provided by the patient.  Abdominal Pain She has history chronic abdominal pain and comes in because of periumbilical pain which started yesterday morning.  Pain is crampy but worsening.  It does sometimes radiate into her back.  Nothing seems to make it better, nothing makes it worse.  It was not affected by eating or having a bowel movement.  She denies nausea or vomiting advised constipation or diarrhea.  She denies fever, chills, sweats.  She has had similar pain in the past, but not recently.  She does see a gastroenterologist because of her abdominal pain and no etiology has been found.  At home, she did notice that pain was better if she applied a heating pad to her abdomen.  She has not taken any medications.  Nothing makes the pain worse.  Past Medical History:  Diagnosis Date  . Anemia   . Chronic abdominal pain   . Chronic back pain   . Environmental allergies   . Headache(784.0)    Migraines  . Migraine   . Refusal of blood transfusions as patient is Jehovah's Witness   . Seizures ([place])    last one 2 years ago    Patient Active Problem List   Diagnosis Date Noted  . Routine Papanicolaou smear 01/01/2018  . Family history of breast cancer 01/01/2018  . Normal labor and delivery 04/17/2016  . IDA (iron deficiency anemia) 04/17/2016  . SVD (spontaneous vaginal delivery) 04/17/2016  . Postpartum care following vaginal delivery (3/13) 04/17/2016  . Seizure ([place]) 08/16/2014  . Chronic back pain 08/16/2014    Past Surgical History:  Procedure Laterality Date  . DENTAL SURGERY       OB History    Gravida  2   Para  2   Term  2   Preterm  0   AB  0   Living  2     SAB  0   TAB  0    Ectopic  0   Multiple  0   Live Births  2            Home Medications    Prior to Admission medications   Medication Sig Start Date End Date Taking? Authorizing Provider  acetaminophen (TYLENOL) 325 MG tablet Take 2 tablets (650 mg total) by mouth every 4 (four) hours as needed (for pain scale < 4). 04/18/16   Azucena Fallen, MD  dicyclomine (BENTYL) 10 MG capsule Take 1 capsule (10 mg total) by mouth 4 (four) times daily -  before meals and at bedtime for 5 days. 06/23/18 06/28/18  Rehman, Areeg N, DO  ibuprofen (ADVIL,MOTRIN) 600 MG tablet Take 1 tablet (600 mg total) by mouth every 6 (six) hours. 04/18/16   Azucena Fallen, MD  norethindrone (MICRONOR,CAMILA,ERRIN) 0.35 MG tablet Take 1 tablet (0.35 mg total) by mouth daily. 01/01/18   Rasch, Artist Pais, NP    Family History Family History  Problem Relation Age of Onset  . Asthma Mother   . Hypertension Mother   . Other Mother        woke up with anesthesia  . Hypertension Sister   . Hypertension Maternal Grandmother     Social  History Social History   Tobacco Use  . Smoking status: Never Smoker  . Smokeless tobacco: Never Used  Substance Use Topics  . Alcohol use: No  . Drug use: Yes    Frequency: 2.0 times per week    Types: Marijuana     Allergies   Morphine and related   Review of Systems Review of Systems  Gastrointestinal: Positive for abdominal pain.  All other systems reviewed and are negative.    Physical Exam Updated Vital Signs BP 132/90   Pulse 67   Temp 98.7 F (37.1 C)   Resp 17   LMP 08/19/2018 (Exact Date)   SpO2 99%   Physical Exam Vitals signs and nursing note reviewed.    23 year old female, resting comfortably and in no acute distress. Vital signs are normal. Oxygen saturation is 99%, which is normal. Head is normocephalic and atraumatic. PERRLA, EOMI. Oropharynx is clear. Neck is nontender and supple without adenopathy or JVD. Back is nontender and there is no CVA  tenderness. Lungs are clear without rales, wheezes, or rhonchi. Chest is nontender. Heart has regular rate and rhythm without murmur. Abdomen is soft, flat, with moderate right upper quadrant tenderness.  There is no rebound or guarding.  Eulah PontMurphy sign is present.  Masses or hepatosplenomegaly and peristalsis is normoactive. Extremities have no cyanosis or edema, full range of motion is present. Skin is warm and dry without rash. Neurologic: Mental status is normal, cranial nerves are intact, there are no motor or sensory deficits.  ED Treatments / Results  Labs (all labs ordered are listed, but only abnormal results are displayed) Labs Reviewed  COMPREHENSIVE METABOLIC PANEL  LIPASE, BLOOD  CBC WITH DIFFERENTIAL/PLATELET  URINALYSIS, ROUTINE W REFLEX MICROSCOPIC  I-STAT BETA HCG BLOOD, ED ([facility], [facility], [facility] ONLY)   Radiology No results found.  Procedures Procedures   Medications Ordered in ED Medications - No data to display   Initial Impression / Assessment and Plan / ED Course  I have reviewed the triage vital signs and the nursing notes.  Pertinent labs & imaging results that were available during my care of the patient were reviewed by me and considered in my medical decision making (see chart for details).  Periumbilical abdominal pain, but exam shows tenderness in epigastrium and right upper quadrant.  She will be sent for ultrasound to look for possible gallstones.  Old records are reviewed, and she did have a negative gallbladder ultrasound 6 years ago.  WBC is mildly elevated.  Urinalysis is normal.  Ultrasound is pending.  Case is signed out to Dr. Hyacinth MeekerMiller.  Final Clinical Impressions(s) / ED Diagnoses   Final diagnoses:  Upper abdominal pain    ED Discharge Orders    None       Dione BoozeGlick, Finnleigh Marchetti, MD 08/21/18 0700

## 2018-08-21 NOTE — Discharge Instructions (Signed)
Your testing today has been normal and reassuring showing no signs of gallstones or other surgical findings.  Your blood counts were slightly elevated at 14,500.  Please have your blood work rechecked within 1 week to make sure that it is getting better at your family doctor's office however if you develop increasing pain, nausea, fever or vomiting or any other concerning findings please return to the emergency department immediately for a repeat evaluation.

## 2018-08-22 ENCOUNTER — Encounter (HOSPITAL_COMMUNITY): Payer: Self-pay | Admitting: Emergency Medicine

## 2018-08-22 ENCOUNTER — Ambulatory Visit (HOSPITAL_COMMUNITY)
Admission: EM | Admit: 2018-08-22 | Discharge: 2018-08-22 | Disposition: A | Discharge status: Home / Self Care | Payer: Medicaid Other | Attending: Emergency Medicine | Admitting: Emergency Medicine

## 2018-08-22 ENCOUNTER — Other Ambulatory Visit: Payer: Self-pay

## 2018-08-22 DIAGNOSIS — J029 Acute pharyngitis, unspecified: Secondary | ICD-10-CM | POA: Diagnosis not present

## 2018-08-22 DIAGNOSIS — Z885 Allergy status to narcotic agent status: Secondary | ICD-10-CM | POA: Diagnosis not present

## 2018-08-22 DIAGNOSIS — Z20828 Contact with and (suspected) exposure to other viral communicable diseases: Secondary | ICD-10-CM | POA: Diagnosis not present

## 2018-08-22 DIAGNOSIS — Z79899 Other long term (current) drug therapy: Secondary | ICD-10-CM | POA: Diagnosis not present

## 2018-08-22 LAB — POCT RAPID STREP A: Streptococcus, Group A Screen (Direct): NEGATIVE

## 2018-08-22 NOTE — Discharge Instructions (Addendum)
Your rapid strep test is negative; a culture will be sent and we will call you if any medication is needed.    Take Tylenol or ibuprofen as needed for the discomfort.    Return here or go to the emergency department if you develop fever, chills, rash, ear pain, shortness of breath, cough, chest pain, abdominal pain, nausea, vomiting, diarrhea, or other concerning symptoms.

## 2018-08-22 NOTE — ED Triage Notes (Signed)
Pt c/o sore throat since yesterday, c/o scratchiness.

## 2018-08-22 NOTE — ED Provider Notes (Signed)
[place]    CSN: [id number] Arrival date & time: 08/22/18  1711     History   Chief Complaint Chief Complaint  Patient presents with   Appointment    530   Sore Throat    HPI Rebecca Cervantes is a 23 y.o. female.   Presents with sore throat x1 day.  She denies fever, chills, rash, ear pain, shortness of breath, cough, chest pain, abdominal pain, nausea, vomiting, diarrhea.  She states she was seen in the emergency department yesterday for abdominal pain but her throat was not bothering her then.  The history is provided by the patient.    Past Medical History:  Diagnosis Date   Anemia    Chronic abdominal pain    Chronic back pain    Environmental allergies    Headache(784.0)    Migraines   Migraine    Refusal of blood transfusions as patient is Jehovah's Witness    Seizures ([place])    last one 2 years ago    Patient Active Problem List   Diagnosis Date Noted   Routine Papanicolaou smear 01/01/2018   Family history of breast cancer 01/01/2018   Normal labor and delivery 04/17/2016   IDA (iron deficiency anemia) 04/17/2016   SVD (spontaneous vaginal delivery) 04/17/2016   Postpartum care following vaginal delivery (3/13) 04/17/2016   Seizure ([place]) 08/16/2014   Chronic back pain 08/16/2014    Past Surgical History:  Procedure Laterality Date   DENTAL SURGERY      OB History    Gravida  2   Para  2   Term  2   Preterm  0   AB  0   Living  2     SAB  0   TAB  0   Ectopic  0   Multiple  0   Live Births  2            Home Medications    Prior to Admission medications   Medication Sig Start Date End Date Taking? Authorizing Provider  acetaminophen (TYLENOL) 325 MG tablet Take 2 tablets (650 mg total) by mouth every 4 (four) hours as needed (for pain scale < 4). 04/18/16   Azucena Fallen, MD  dicyclomine (BENTYL) 10 MG capsule Take 1 capsule (10 mg total) by mouth 4 (four) times daily -  before meals and  at bedtime for 14 days. 08/21/18 09/04/18  Noemi Chapel, MD  norethindrone (MICRONOR,CAMILA,ERRIN) 0.35 MG tablet Take 1 tablet (0.35 mg total) by mouth daily. 01/01/18   Rasch, Artist Pais, NP    Family History Family History  Problem Relation Age of Onset   Asthma Mother    Hypertension Mother    Other Mother        woke up with anesthesia   Hypertension Sister    Hypertension Maternal Grandmother     Social History Social History   Tobacco Use   Smoking status: Never Smoker   Smokeless tobacco: Never Used  Substance Use Topics   Alcohol use: No   Drug use: Yes    Frequency: 2.0 times per week    Types: Marijuana     Allergies   Morphine and related   Review of Systems Review of Systems  Constitutional: Negative for chills and fever.  HENT: Positive for sore throat. Negative for ear pain.   Eyes: Negative for pain and visual disturbance.  Respiratory: Negative for cough and shortness of breath.   Cardiovascular: Negative for chest pain  and palpitations.  Gastrointestinal: Negative for abdominal pain and vomiting.  Genitourinary: Negative for dysuria and hematuria.  Musculoskeletal: Negative for arthralgias and back pain.  Skin: Negative for color change and rash.  Neurological: Negative for seizures and syncope.  All other systems reviewed and are negative.    Physical Exam Triage Vital Signs ED Triage Vitals  Enc Vitals Group     BP 08/22/18 1740 111/80     Pulse Rate 08/22/18 1740 86     Resp 08/22/18 1740 16     Temp 08/22/18 1746 (!) 97.5 F (36.4 C)     Temp src --      SpO2 08/22/18 1740 100 %     Weight --      Height --      Head Circumference --      Peak Flow --      Pain Score 08/22/18 1742 5     Pain Loc --      Pain Edu? --      Excl. in GC? --    No data found.  Updated Vital Signs BP 111/80    Pulse 86    Temp (!) 97.5 F (36.4 C)    Resp 16    LMP 08/19/2018 (Exact Date)    SpO2 100%   Visual Acuity Right Eye  Distance:   Left Eye Distance:   Bilateral Distance:    Right Eye Near:   Left Eye Near:    Bilateral Near:     Physical Exam Vitals signs and nursing note reviewed.  Constitutional:      General: She is not in acute distress.    Appearance: She is well-developed.  HENT:     Head: Normocephalic and atraumatic.     Right Ear: Tympanic membrane normal.     Left Ear: Tympanic membrane normal.     Nose: Nose normal.     Mouth/Throat:     Mouth: Mucous membranes are moist.     Pharynx: Posterior oropharyngeal erythema present. No oropharyngeal exudate.  Eyes:     Conjunctiva/sclera: Conjunctivae normal.  Neck:     Musculoskeletal: Neck supple.  Cardiovascular:     Rate and Rhythm: Normal rate and regular rhythm.  Pulmonary:     Effort: Pulmonary effort is normal. No respiratory distress.     Breath sounds: Normal breath sounds.  Abdominal:     Palpations: Abdomen is soft.     Tenderness: There is no abdominal tenderness.  Skin:    General: Skin is warm and dry.     Findings: No rash.  Neurological:     Mental Status: She is alert.      UC Treatments / Results  Labs (all labs ordered are listed, but only abnormal results are displayed) Labs Reviewed  NOVEL CORONAVIRUS, NAA (HOSPITAL ORDER, SEND-OUT TO REF LAB)  CULTURE, GROUP A STREP [facility])  POCT RAPID STREP A    EKG   Radiology [place] Abdomen Limited  Result Date: 08/21/2018 CLINICAL DATA:  Abdominal pain EXAM: ULTRASOUND ABDOMEN LIMITED RIGHT UPPER QUADRANT COMPARISON:  None. FINDINGS: Gallbladder: No gallstones or wall thickening visualized. No sonographic Murphy sign noted by sonographer. Common bile duct: Diameter: 3 mm Liver: No focal lesion identified. Within normal limits in parenchymal echogenicity. Portal vein is patent on color Doppler imaging with normal direction of blood flow towards the liver. IMPRESSION: Normal right upper quadrant ultrasound Electronically Signed   By: Marnee SpringJonathon  Watts M.D.   On:  08/21/2018 07:31  Procedures Procedures (including critical care time)  Medications Ordered in UC Medications - No data to display  Initial Impression / Assessment and Plan / UC Course  I have reviewed the triage vital signs and the nursing notes.  Pertinent labs & imaging results that were available during my care of the patient were reviewed by me and considered in my medical decision making (see chart for details).   Pharyngitis.  Rapid strep negative; are pending.  Discussed with patient that she should take Tylenol or ibuprofen as needed.  Discussed that she should return here or go to the emergency department if she develops fever, chills, rash, otalgia, shortness of breath, cough, chest pain, abdominal pain, nausea, vomiting, diarrhea, other concerning symptoms.     Final Clinical Impressions(s) / UC Diagnoses   Final diagnoses:  Pharyngitis, unspecified etiology     Discharge Instructions     Your rapid strep test is negative; a culture will be sent and we will call you if any medication is needed.    Take Tylenol or ibuprofen as needed for the discomfort.    Return here or go to the emergency department if you develop fever, chills, rash, ear pain, shortness of breath, cough, chest pain, abdominal pain, nausea, vomiting, diarrhea, or other concerning symptoms.        ED Prescriptions    None     Controlled Substance Prescriptions [place] Controlled Substance Registry consulted? Not Applicable   Mickie Bailate, Aunya Lemler H, NP 08/22/18 [id number]

## 2018-08-24 LAB — CULTURE, GROUP A STREP (THRC)

## 2018-08-24 LAB — NOVEL CORONAVIRUS, NAA (HOSP ORDER, SEND-OUT TO REF LAB; TAT 18-24 HRS): SARS-CoV-2, NAA: NOT DETECTED

## 2018-08-26 ENCOUNTER — Encounter (HOSPITAL_COMMUNITY): Payer: Self-pay

## 2018-08-26 ENCOUNTER — Telehealth (HOSPITAL_COMMUNITY): Payer: Self-pay | Admitting: Emergency Medicine

## 2018-08-26 NOTE — Telephone Encounter (Signed)
Attempted to call pt about culture, no answer. Covid Negative. Notified in [place].

## 2018-09-25 ENCOUNTER — Telehealth: Payer: Self-pay | Admitting: Family Medicine

## 2018-09-25 NOTE — Telephone Encounter (Signed)
Attempted to call patient about her appointment on 8/21 @ 8:50. No answer, left voicemail instructing patient to wear a face mask for the entire appointment and no visitors are allowed during the visit. Patient instructed not to attend the appointment if she was any symptoms. Symptom list and office number left.

## 2018-09-26 ENCOUNTER — Ambulatory Visit: Payer: Medicaid Other

## 2018-10-09 ENCOUNTER — Telehealth: Payer: Self-pay | Admitting: Family Medicine

## 2018-10-09 NOTE — Telephone Encounter (Signed)
Spoke to patient about her appointment on 9/4 @ 10:00. Patient instructed to wear a face mask for the entire appointment and no visitors are allowed with her during the visit. Patient screened for covid symptoms and denied having any

## 2018-10-10 ENCOUNTER — Other Ambulatory Visit (HOSPITAL_COMMUNITY)
Admission: RE | Admit: 2018-10-10 | Discharge: 2018-10-10 | Disposition: A | Discharge status: Home / Self Care | Payer: Medicaid Other | Source: Ambulatory Visit | Attending: Family Medicine | Admitting: Family Medicine

## 2018-10-10 ENCOUNTER — Other Ambulatory Visit: Payer: Self-pay

## 2018-10-10 ENCOUNTER — Ambulatory Visit (INDEPENDENT_AMBULATORY_CARE_PROVIDER_SITE_OTHER): Payer: Medicaid Other | Admitting: Lactation Services

## 2018-10-10 DIAGNOSIS — Z202 Contact with and (suspected) exposure to infections with a predominantly sexual mode of transmission: Secondary | ICD-10-CM

## 2018-10-10 LAB — POCT PREGNANCY, URINE: Preg Test, Ur: NEGATIVE

## 2018-10-10 NOTE — Progress Notes (Signed)
Pt here for STD testing. Pt reports she needs it as she has not had HIV test in a little while and wants to stay safe. Pt reports she had an incident a few weeks ago that she would not like to discuss and the reason she wants to be tested. Pt reports she is no Birth control and does not think she is pregnant. She is not aware that she was exposed but want to make sure she was not.   Pt reports she does not have any new vaginal discharge. She reports she has a white milky discharge that is normal for her. Pt reports she has no odors or vaginal pain.   Spoke with Kerry Hough, PA who reports pt can be offered Hep B/C, RPR in addition to HIV. Pt reports she would like to obtain.   Pregnancy test Negative. LMP last month, unsure of date.   Self swab and labs drawn. Pt informed that lab results with take a few days to come back and she will be notified of any abnormal results. Pt voiced understanding.

## 2018-10-11 LAB — HEPATITIS C ANTIBODY: Hep C Virus Ab: <0.1 s/co ratio (ref 0.0–0.9)

## 2018-10-11 LAB — RPR: RPR Ser Ql: NONREACTIVE

## 2018-10-11 LAB — CERVICOVAGINAL ANCILLARY ONLY
Chlamydia: NEGATIVE
Neisseria Gonorrhea: NEGATIVE
Trichomonas: NEGATIVE

## 2018-10-11 LAB — HIV ANTIBODY (ROUTINE TESTING W REFLEX): HIV Screen 4th Generation wRfx: NONREACTIVE

## 2018-10-11 LAB — HEPATITIS B SURFACE ANTIGEN: Hepatitis B Surface Ag: NEGATIVE

## 2018-11-08 ENCOUNTER — Other Ambulatory Visit (INDEPENDENT_AMBULATORY_CARE_PROVIDER_SITE_OTHER): Payer: Self-pay | Admitting: Emergency Medicine

## 2018-11-10 ENCOUNTER — Telehealth (INDEPENDENT_AMBULATORY_CARE_PROVIDER_SITE_OTHER): Payer: Medicaid Other

## 2018-11-10 ENCOUNTER — Other Ambulatory Visit (INDEPENDENT_AMBULATORY_CARE_PROVIDER_SITE_OTHER): Payer: Self-pay | Admitting: Emergency Medicine

## 2018-11-10 DIAGNOSIS — Z3009 Encounter for other general counseling and advice on contraception: Secondary | ICD-10-CM

## 2018-11-10 MED ORDER — NORETHINDRONE 0.35 MG PO TABS: 1.0000 | ORAL_TABLET | Freq: Every day | ORAL | 5 refills | Status: DC

## 2018-11-10 NOTE — Telephone Encounter (Signed)
Pt left message on nurse VM requesting a medication refill.   Returned pt's phone call. Pt states she is out of birth control and would like more refills. Reviewed pt's chart to investigate need for annual visit. Notified pt I will send norethindrone refill to her pharmacy. Asked pt to please call front office to schedule annual visit in January. Pt verbalizes understanding and has no further questions.

## 2020-06-06 ENCOUNTER — Encounter (HOSPITAL_COMMUNITY): Payer: Self-pay

## 2020-06-06 ENCOUNTER — Emergency Department (HOSPITAL_COMMUNITY): Payer: Medicaid Other

## 2020-06-06 ENCOUNTER — Other Ambulatory Visit: Payer: Self-pay

## 2020-06-06 ENCOUNTER — Inpatient Hospital Stay (HOSPITAL_COMMUNITY)
Admission: EM | Admit: 2020-06-06 | Discharge: 2020-06-09 | DRG: 440 | Disposition: A | Discharge status: Home / Self Care | Payer: Medicaid Other | Attending: Internal Medicine | Admitting: Internal Medicine

## 2020-06-06 DIAGNOSIS — F17210 Nicotine dependence, cigarettes, uncomplicated: Secondary | ICD-10-CM | POA: Diagnosis present

## 2020-06-06 DIAGNOSIS — Z8249 Family history of ischemic heart disease and other diseases of the circulatory system: Secondary | ICD-10-CM

## 2020-06-06 DIAGNOSIS — D509 Iron deficiency anemia, unspecified: Secondary | ICD-10-CM | POA: Diagnosis present

## 2020-06-06 DIAGNOSIS — K828 Other specified diseases of gallbladder: Secondary | ICD-10-CM | POA: Diagnosis not present

## 2020-06-06 DIAGNOSIS — K859 Acute pancreatitis without necrosis or infection, unspecified: Secondary | ICD-10-CM | POA: Diagnosis present

## 2020-06-06 DIAGNOSIS — Z20822 Contact with and (suspected) exposure to covid-19: Secondary | ICD-10-CM | POA: Diagnosis present

## 2020-06-06 DIAGNOSIS — R1011 Right upper quadrant pain: Secondary | ICD-10-CM

## 2020-06-06 DIAGNOSIS — Z885 Allergy status to narcotic agent status: Secondary | ICD-10-CM

## 2020-06-06 DIAGNOSIS — G8929 Other chronic pain: Secondary | ICD-10-CM | POA: Diagnosis present

## 2020-06-06 DIAGNOSIS — M549 Dorsalgia, unspecified: Secondary | ICD-10-CM | POA: Diagnosis present

## 2020-06-06 DIAGNOSIS — K852 Alcohol induced acute pancreatitis without necrosis or infection: Principal | ICD-10-CM | POA: Diagnosis present

## 2020-06-06 DIAGNOSIS — Z825 Family history of asthma and other chronic lower respiratory diseases: Secondary | ICD-10-CM

## 2020-06-06 LAB — CBC
HCT: 37.1 % (ref 36.0–46.0)
Hemoglobin: 12.6 g/dL (ref 12.0–15.0)
MCH: 38.3 pg — ABNORMAL HIGH (ref 26.0–34.0)
MCHC: 34 g/dL (ref 30.0–36.0)
MCV: 112.8 fL — ABNORMAL HIGH (ref 80.0–100.0)
Platelets: 227 10*3/uL (ref 150–400)
RBC: 3.29 MIL/uL — ABNORMAL LOW (ref 3.87–5.11)
RDW: 12.1 % (ref 11.5–15.5)
WBC: 11.5 10*3/uL — ABNORMAL HIGH (ref 4.0–10.5)
nRBC: 0 % (ref 0.0–0.2)

## 2020-06-06 LAB — COMPREHENSIVE METABOLIC PANEL
ALT: 16 U/L (ref 0–44)
AST: 41 U/L (ref 15–41)
Albumin: 4 g/dL (ref 3.5–5.0)
Alkaline Phosphatase: 46 U/L (ref 38–126)
Anion gap: 14 (ref 5–15)
BUN: 12 mg/dL (ref 6–20)
CO2: 21 mmol/L — ABNORMAL LOW (ref 22–32)
Calcium: 9.1 mg/dL (ref 8.9–10.3)
Chloride: 102 mmol/L (ref 98–111)
Creatinine, Ser: 0.96 mg/dL (ref 0.44–1.00)
GFR, Estimated: >60 mL/min (ref >60)
Glucose, Bld: 120 mg/dL — ABNORMAL HIGH (ref 70–99)
Potassium: 3.7 mmol/L (ref 3.5–5.1)
Sodium: 137 mmol/L (ref 135–145)
Total Bilirubin: 1.1 mg/dL (ref 0.3–1.2)
Total Protein: 6.9 g/dL (ref 6.5–8.1)

## 2020-06-06 LAB — RESP PANEL BY RT-PCR (FLU A&B, COVID) ARPGX2
Influenza A by PCR: NEGATIVE
Influenza B by PCR: NEGATIVE
SARS Coronavirus 2 by RT PCR: NEGATIVE

## 2020-06-06 LAB — URINALYSIS, ROUTINE W REFLEX MICROSCOPIC
Bacteria, UA: NONE SEEN
Bilirubin Urine: NEGATIVE
Glucose, UA: NEGATIVE mg/dL
Hgb urine dipstick: NEGATIVE
Ketones, ur: 20 mg/dL — AB
Leukocytes,Ua: NEGATIVE
Nitrite: NEGATIVE
Protein, ur: 30 mg/dL — AB
Specific Gravity, Urine: 1.024 (ref 1.005–1.030)
pH: 7 (ref 5.0–8.0)

## 2020-06-06 LAB — LIPID PANEL
Cholesterol: 185 mg/dL (ref 0–200)
HDL: 120 mg/dL (ref >40)
LDL Cholesterol: 55 mg/dL (ref 0–99)
Total CHOL/HDL Ratio: 1.5 RATIO
Triglycerides: 52 mg/dL (ref <150)
VLDL: 10 mg/dL (ref 0–40)

## 2020-06-06 LAB — HIV ANTIBODY (ROUTINE TESTING W REFLEX): HIV Screen 4th Generation wRfx: NONREACTIVE

## 2020-06-06 LAB — VITAMIN B12: Vitamin B-12: 309 pg/mL (ref 180–914)

## 2020-06-06 LAB — FOLATE: Folate: 13.7 ng/mL (ref >5.9)

## 2020-06-06 LAB — LIPASE, BLOOD: Lipase: 285 U/L — ABNORMAL HIGH (ref 11–51)

## 2020-06-06 LAB — I-STAT BETA HCG BLOOD, ED (MC, WL, AP ONLY): I-stat hCG, quantitative: <5 m[IU]/mL (ref <5)

## 2020-06-06 LAB — TSH: TSH: 1.17 u[IU]/mL (ref 0.350–4.500)

## 2020-06-06 MED ORDER — ONDANSETRON HCL 4 MG/2ML IJ SOLN
4.0000 mg | Freq: Four times a day (QID) | INTRAMUSCULAR | Status: DC | PRN
  Administered 2020-06-06 – 2020-06-08 (×6): 4 mg via INTRAVENOUS
  Filled 2020-06-06 (×8): qty 2

## 2020-06-06 MED ORDER — ACETAMINOPHEN 650 MG RE SUPP: 650.0000 mg | Freq: Four times a day (QID) | RECTAL | Status: DC | PRN

## 2020-06-06 MED ORDER — HYDROMORPHONE HCL 1 MG/ML IJ SOLN
0.5000 mg | Freq: Once | INTRAMUSCULAR | Status: AC
  Administered 2020-06-06: 0.5 mg via INTRAVENOUS
  Filled 2020-06-06: qty 1

## 2020-06-06 MED ORDER — ONDANSETRON HCL 4 MG/2ML IJ SOLN
4.0000 mg | Freq: Once | INTRAMUSCULAR | Status: AC
  Administered 2020-06-06: 4 mg via INTRAVENOUS
  Filled 2020-06-06: qty 2

## 2020-06-06 MED ORDER — LACTATED RINGERS IV SOLN: INTRAVENOUS | Status: DC

## 2020-06-06 MED ORDER — SENNOSIDES-DOCUSATE SODIUM 8.6-50 MG PO TABS: 1.0000 | ORAL_TABLET | Freq: Every evening | ORAL | Status: DC | PRN

## 2020-06-06 MED ORDER — ENOXAPARIN SODIUM 40 MG/0.4ML IJ SOSY
40.0000 mg | PREFILLED_SYRINGE | INTRAMUSCULAR | Status: DC
  Administered 2020-06-06 – 2020-06-07 (×2): 40 mg via SUBCUTANEOUS
  Filled 2020-06-06 (×2): qty 0.4

## 2020-06-06 MED ORDER — FENTANYL CITRATE (PF) 100 MCG/2ML IJ SOLN
50.0000 ug | Freq: Once | INTRAMUSCULAR | Status: AC
  Administered 2020-06-06: 50 ug via INTRAVENOUS
  Filled 2020-06-06: qty 2

## 2020-06-06 MED ORDER — HYDROMORPHONE HCL 1 MG/ML IJ SOLN
0.5000 mg | INTRAMUSCULAR | Status: DC | PRN
Start: 2020-06-06 — End: 2020-06-07
  Administered 2020-06-06 – 2020-06-07 (×4): 0.5 mg via INTRAVENOUS
  Filled 2020-06-06 (×4): qty 1

## 2020-06-06 MED ORDER — LACTATED RINGERS IV BOLUS
1000.0000 mL | Freq: Once | INTRAVENOUS | Status: AC
  Administered 2020-06-06: 1000 mL via INTRAVENOUS

## 2020-06-06 MED ORDER — ACETAMINOPHEN 325 MG PO TABS: 650.0000 mg | ORAL_TABLET | Freq: Four times a day (QID) | ORAL | Status: DC | PRN

## 2020-06-06 NOTE — ED Notes (Signed)
Pt called out for nausea medicine

## 2020-06-06 NOTE — ED Notes (Signed)
Provider at bedside

## 2020-06-06 NOTE — ED Notes (Signed)
Attempted to give reportx1 

## 2020-06-06 NOTE — H&P (Signed)
Date: 06/06/2020               Patient Name:  Rebecca Cervantes MRN: [id number]  DOB: 06-08-1995 Age / Sex: 25 y.o., female   PCP: Medicine, [facility]              Medical Service: Internal Medicine Teaching Service              Attending Physician: Dr. Mikey BussingHoffman, Marthenia RollingErik C, DO    First Contact: Janeal HolmesBranden Mabe, MS3 Pager: [phone]  Second Contact: Dr. Quincy SimmondsJessica Liang Pager: [phone]  Third Contact Dr. Dolan AmenSteven Acen Craun Pager: [phone]       After Hours (After 5p/  First Contact Pager: [phone]  weekends / holidays): Second Contact Pager: [phone]   Chief Complaint: RUQ abdominal pain  History of Present Illness:  Rebecca Idolina PrimerL Cervantes is a 25 y.o. female with a PMH of chronic abdominal pain, chronic back pain, iron deficiency anemia, and seizures who presents with a one day history of RUQ abdominal pain. She states at 6 AM yesterday, she started to feel an uncomfortable sensation in her upper abdomen that she attributed to gas. She took some gas-x, but that did not relieve her symptoms. Over the course of the day, she began to experience pain in her epigastrium and RUQ that continued to worsen. She said she tried sleeping and laying in different positions, but the pain continued. She has never had this kind of pain before. She did not try any medications at home for the pain. She also became nauseous and began to vomit bilious fluid without blood. She vomited 5 or 6 times over the day. She also had some diarrhea last night that was non-bloody. Because of her worsening condition, her mother urged her to present today to the hospital.   Upon admission to the ED, her pain was 8.5/10 in intensity. She describes the pain as continuous, stabbing in nature, and now radiating from her upper abdomen to her flank and back bilaterally. The pain is worsened when lying on her back, when she moves, and when she takes deep breaths. She states the dilaudid she was given in the ED helped ease the pain to a  6/10 in intensity, but now her pain is back up to 8/10 after the medication eased off. She vomited twice in the ED bathroom but has not vomited since those episodes. She also has not eaten since her symptoms started yesterday. Her last menstrual period was one week ago and normal. She denies any history of prior abdominal surgeries. She denies any constipation or dysuria.  Meds: Current Outpatient Medications  Medication Instructions  . acetaminophen (TYLENOL) 650 mg, Oral, Every 4 hours PRN  . dicyclomine (BENTYL) 10 mg, Oral, 3 times daily before meals & bedtime  . norethindrone (MICRONOR) 0.35 mg, Oral, Daily   Allergies: Allergies as of 06/06/2020 - Review Complete 06/06/2020  Allergen Reaction Noted  . Morphine and related Hives 04/29/2011   PMH: Past Medical History:  Diagnosis Date  . Anemia   . Chronic abdominal pain   . Chronic back pain   . Environmental allergies   . Headache(784.0)    Migraines  . Migraine   . Refusal of blood transfusions as patient is Jehovah's Witness   . Seizures ([facility])    last one 2 years ago   Family History: Grandmother: breast cancer, hypertension Mother: breast cancer, sarcoidosis, hypertension, asthma Sister: hypertension  Social History: Patient lives in [place] with her two children and  mother. She is an avid fan of [employer] with multiple tattoos from the franchise on her arms. She is a Scientist, product/process development. She has smoked 1 cigarette per day, "if that," for 8 years. She drinks 5-6 shots on the weekends. She smokes marijuana twice per week.  Review of Systems: A complete ROS was negative except as per HPI.  Physical Exam: Blood pressure 104/87, pulse 64, temperature 97.8 F (36.6 C), temperature source Oral, resp. rate 15, SpO2 100 %, currently breastfeeding.  Physical Exam Constitutional:      Appearance: She is well-developed. She is ill-appearing.  HENT:     Head: Normocephalic and atraumatic.  Eyes:     Extraocular  Movements: Extraocular movements intact.  Cardiovascular:     Rate and Rhythm: Normal rate and regular rhythm.     Heart sounds: Normal heart sounds.  Pulmonary:     Effort: Pulmonary effort is normal.     Breath sounds: Normal breath sounds.  Abdominal:     General: Abdomen is flat. Bowel sounds are decreased.     Palpations: Abdomen is soft.     Tenderness: There is abdominal tenderness in the right upper quadrant and epigastric area.  Musculoskeletal:        General: Normal range of motion.  Skin:    General: Skin is warm and dry.  Neurological:     General: No focal deficit present.     Mental Status: She is alert and oriented to person, place, and time.  Psychiatric:        Mood and Affect: Mood normal.        Behavior: Behavior normal.        Thought Content: Thought content normal.    Recent Results (from the past 2160 hour(s))  Urinalysis, Routine w reflex microscopic Urine, Clean Catch     Status: Abnormal   Collection Time: 06/06/20 10:17 AM  Result Value Ref Range   Color, Urine YELLOW YELLOW   APPearance HAZY (A) CLEAR   Specific Gravity, Urine 1.024 1.005 - 1.030   pH 7.0 5.0 - 8.0   Glucose, UA NEGATIVE NEGATIVE mg/dL   Hgb urine dipstick NEGATIVE NEGATIVE   Bilirubin Urine NEGATIVE NEGATIVE   Ketones, ur 20 (A) NEGATIVE mg/dL   Protein, ur 30 (A) NEGATIVE mg/dL   Nitrite NEGATIVE NEGATIVE   Leukocytes,Ua NEGATIVE NEGATIVE   RBC / HPF 0-5 0 - 5 RBC/hpf   WBC, UA 0-5 0 - 5 WBC/hpf   Bacteria, UA NONE SEEN NONE SEEN   Squamous Epithelial / LPF 0-5 0 - 5   Mucus PRESENT    Amorphous Crystal PRESENT     Comment: Performed at [facility], [address], [place], [place] [address]  Lipase, blood     Status: Abnormal   Collection Time: 06/06/20 10:20 AM  Result Value Ref Range   Lipase 285 (H) 11 - 51 U/L    Comment: Performed at [facility], [address], [place], [place] [address]  Comprehensive metabolic panel     Status: Abnormal    Collection Time: 06/06/20 10:20 AM  Result Value Ref Range   Sodium 137 135 - 145 mmol/L   Potassium 3.7 3.5 - 5.1 mmol/L   Chloride 102 98 - 111 mmol/L   CO2 21 (L) 22 - 32 mmol/L   Glucose, Bld 120 (H) 70 - 99 mg/dL    Comment: Glucose reference range applies only to samples taken after fasting for at least 8  hours.   BUN 12 6 - 20 mg/dL   Creatinine, Ser 5.91 0.44 - 1.00 mg/dL   Calcium 9.1 8.9 - 63.8 mg/dL   Total Protein 6.9 6.5 - 8.1 g/dL   Albumin 4.0 3.5 - 5.0 g/dL   AST 41 15 - 41 U/L   ALT 16 0 - 44 U/L   Alkaline Phosphatase 46 38 - 126 U/L   Total Bilirubin 1.1 0.3 - 1.2 mg/dL   GFR, Estimated >46 >65 mL/min    Comment: (NOTE) Calculated using the CKD-EPI Creatinine Equation (2021)    Anion gap 14 5 - 15    Comment: Performed at [facility], [address] [place], [place] [address]  CBC     Status: Abnormal   Collection Time: 06/06/20 10:20 AM  Result Value Ref Range   WBC 11.5 (H) 4.0 - 10.5 K/uL   RBC 3.29 (L) 3.87 - 5.11 MIL/uL   Hemoglobin 12.6 12.0 - 15.0 g/dL   HCT 01.7 79.3 - 90.3 %   MCV 112.8 (H) 80.0 - 100.0 fL   MCH 38.3 (H) 26.0 - 34.0 pg   MCHC 34.0 30.0 - 36.0 g/dL   RDW 00.9 23.3 - 00.7 %   Platelets 227 150 - 400 K/uL   nRBC 0.0 0.0 - 0.2 %    Comment: Performed at [facility], [address] [place], [place] [address]  I-Stat beta hCG blood, ED     Status: None   Collection Time: 06/06/20 10:58 AM  Result Value Ref Range   I-stat hCG, quantitative <5.0 <5 mIU/mL   Comment 3            Comment:   GEST. AGE      CONC.  (mIU/mL)   <=1 WEEK        5 - 50     2 WEEKS       50 - 500     3 WEEKS       100 - 10,000     4 WEEKS     1,000 - 30,000        FEMALE AND NON-PREGNANT FEMALE:     LESS THAN 5 mIU/mL   Resp Panel by RT-PCR (Flu A&B, Covid) Nasopharyngeal Swab     Status: None   Collection Time: 06/06/20  2:00 PM   Specimen: Nasopharyngeal Swab; Nasopharyngeal(NP) swabs in vial transport medium  Result Value Ref Range    SARS Coronavirus 2 by RT PCR NEGATIVE NEGATIVE    Comment: (NOTE) SARS-CoV-2 target nucleic acids are NOT DETECTED.  The SARS-CoV-2 RNA is generally detectable in upper respiratory specimens during the acute phase of infection. The lowest concentration of SARS-CoV-2 viral copies this assay can detect is 138 copies/mL. A negative result does not preclude SARS-Cov-2 infection and should not be used as the sole basis for treatment or other patient management decisions. A negative result may occur with  improper specimen collection/handling, submission of specimen other than nasopharyngeal swab, presence of viral mutation(s) within the areas targeted by this assay, and inadequate number of viral copies(<138 copies/mL). A negative result must be combined with clinical observations, patient history, and epidemiological information. The expected result is Negative.  Fact Sheet for Patients:  BloggerCourse.com  Fact Sheet for Healthcare Providers:  SeriousBroker.it  This test is no t yet approved or cleared by the [place] FDA and  has been authorized for detection and/or diagnosis of SARS-CoV-2 by FDA under an Emergency Use Authorization (  EUA). This EUA will remain  in effect (meaning this test can be used) for the duration of the COVID-19 declaration under Section 564(b)(1) of the Act, 21 U.S.C.section 360bbb-3(b)(1), unless the authorization is terminated  or revoked sooner.       Influenza A by PCR NEGATIVE NEGATIVE   Influenza B by PCR NEGATIVE NEGATIVE    Comment: (NOTE) The Xpert Xpress SARS-CoV-2/FLU/RSV plus assay is intended as an aid in the diagnosis of influenza from Nasopharyngeal swab specimens and should not be used as a sole basis for treatment. Nasal washings and aspirates are unacceptable for Xpert Xpress SARS-CoV-2/FLU/RSV testing.  Fact Sheet for Patients: BloggerCourse.com  Fact  Sheet for Healthcare Providers: SeriousBroker.it  This test is not yet approved or cleared by the [place] FDA and has been authorized for detection and/or diagnosis of SARS-CoV-2 by FDA under an Emergency Use Authorization (EUA). This EUA will remain in effect (meaning this test can be used) for the duration of the COVID-19 declaration under Section 564(b)(1) of the Act, 21 U.S.C. section 360bbb-3(b)(1), unless the authorization is terminated or revoked.  Performed at [facility], [address], [place], [place] [address]    US Abdomen RUQ: IMPRESSION: No cholelithiasis or sonographic evidence of acute cholecystitis.  Assessment & Plan by Problem: Active Problems:   Pancreatitis  1. Acute Pancreatitis Patient is noticeably in pain and uncomfortable. She is afebrile and HDS. She is still nauseous. Leukocytosis present. MCV of 112.8 indicates macrocytosis; however, H&H is normal so no anemia at this time. Lipase elevated >3x the upper limit of normal, indicating acute pancreatitis. [place] was negative for gallstones or acute cholecystitis. She does endorse alcohol use of 5-6 shots on the weekends, potentially contributing to elevated MCV and acute pancreatitis. -IV LR 100 mL/hr -Diet NPO for now -Pain control with dilaudid 0.5 mg IV Q4H prn, tylenol 650 mg suppository Q6H prn -Zofran 4 mg IV Q6H prn for nausea  Dispo: Admit patient to Observation with expected length of stay less than 2 midnights.  Signed: Samantha Crimes, Medical Student 06/06/2020, 5:29 PM  Pager: [phone] After 5pm on weekdays and 1pm on weekends: On Call pager [phone]   Attestation for Student Documentation:  I personally was present and performed or re-performed the history, physical exam and medical decision-making activities of this service and have verified that the service and findings are accurately documented in the student's note.  Dolan Amen, MD 06/06/2020, 6:38  PM

## 2020-06-06 NOTE — ED Notes (Signed)
Patient transported to Ultrasound 

## 2020-06-06 NOTE — ED Notes (Addendum)
unsuccessful IV starts  2 RN and 3 total IV sticks. IV team ordered.

## 2020-06-06 NOTE — ED Provider Notes (Signed)
[facility] EMERGENCY DEPARTMENT Provider Note   CSN: [id number] Arrival date & time: 06/06/20  1004     History Chief Complaint  Patient presents with  . Abdominal Pain    Rebecca Cervantes is a 25 y.o. female.  HPI P/w abdominal pain RUQ and epigastric Worsening x24h gradually Started as a gas pain, now is a severe ache in RUQ and radiating across front of abdomen Vomiting, 5 or 6 times in 12 hours, nbnb No urinary sx LMP 1wk ago and was normal No h/o abdominal surgeries No fevers Loose stool last night    Past Medical History:  Diagnosis Date  . Anemia   . Chronic abdominal pain   . Chronic back pain   . Environmental allergies   . Headache(784.0)    Migraines  . Migraine   . Refusal of blood transfusions as patient is Jehovah's Witness   . Seizures (HCC)    last one 2 years ago    Patient Active Problem List   Diagnosis Date Noted  . Routine Papanicolaou smear 01/01/2018  . Family history of breast cancer 01/01/2018  . Normal labor and delivery 04/17/2016  . IDA (iron deficiency anemia) 04/17/2016  . SVD (spontaneous vaginal delivery) 04/17/2016  . Postpartum care following vaginal delivery (3/13) 04/17/2016  . Seizure (HCC) 08/16/2014  . Chronic back pain 08/16/2014    Past Surgical History:  Procedure Laterality Date  . DENTAL SURGERY       OB History    Gravida  2   Para  2   Term  2   Preterm  0   AB  0   Living  2     SAB  0   IAB  0   Ectopic  0   Multiple  0   Live Births  2           Family History  Problem Relation Age of Onset  . Asthma Mother   . Hypertension Mother   . Other Mother        woke up with anesthesia  . Hypertension Sister   . Hypertension Maternal Grandmother     Social History   Tobacco Use  . Smoking status: Never Smoker  . Smokeless tobacco: Never Used  Substance Use Topics  . Alcohol use: No  . Drug use: Yes    Frequency: 2.0 times per week    Types: Marijuana     Home Medications Prior to Admission medications   Medication Sig Start Date End Date Taking? Authorizing Provider  acetaminophen (TYLENOL) 325 MG tablet Take 2 tablets (650 mg total) by mouth every 4 (four) hours as needed (for pain scale < 4). Patient not taking: Reported on 06/06/2020 04/18/16   Shea Evans, MD  dicyclomine (BENTYL) 10 MG capsule Take 1 capsule (10 mg total) by mouth 4 (four) times daily -  before meals and at bedtime for 14 days. Patient not taking: Reported on 06/06/2020 08/21/18 09/04/18  Eber Hong, MD  norethindrone (MICRONOR) 0.35 MG tablet Take 1 tablet (0.35 mg total) by mouth daily. Patient not taking: Reported on 06/06/2020 11/10/18   Reva Bores, MD    Allergies    Morphine and related  Review of Systems   Review of Systems  Constitutional: Negative for chills and fever.  HENT: Negative for ear pain and sore throat.   Eyes: Negative for pain and visual disturbance.  Respiratory: Negative for cough and shortness of breath.   Cardiovascular: Negative  for chest pain and palpitations.  Gastrointestinal: Positive for abdominal pain, diarrhea, nausea and vomiting.  Genitourinary: Negative for dysuria and hematuria.  Musculoskeletal: Negative for arthralgias and back pain.  Skin: Negative for color change and rash.  Neurological: Negative for seizures and syncope.  All other systems reviewed and are negative.   Physical Exam Updated Vital Signs BP 95/63   Pulse 63   Temp 97.8 F (36.6 C) (Oral)   Resp 16   SpO2 100%   Physical Exam Vitals and nursing note reviewed.  Constitutional:      General: She is not in acute distress.    Appearance: She is well-developed.  HENT:     Head: Normocephalic and atraumatic.  Eyes:     Conjunctiva/sclera: Conjunctivae normal.  Cardiovascular:     Rate and Rhythm: Normal rate and regular rhythm.     Heart sounds: No murmur heard.   Pulmonary:     Effort: Pulmonary effort is normal. No respiratory  distress.     Breath sounds: Normal breath sounds.  Abdominal:     Palpations: Abdomen is soft.     Tenderness: There is abdominal tenderness in the right upper quadrant and epigastric area. There is guarding (voluntary RUQ and epigastrium). Positive signs include Murphy's sign.  Musculoskeletal:     Cervical back: Neck supple.  Skin:    General: Skin is warm and dry.  Neurological:     Mental Status: She is alert.     ED Results / Procedures / Treatments   Labs (all labs ordered are listed, but only abnormal results are displayed) Labs Reviewed  LIPASE, BLOOD - Abnormal; Notable for the following components:      Result Value   Lipase 285 (*)    All other components within normal limits  COMPREHENSIVE METABOLIC PANEL - Abnormal; Notable for the following components:   CO2 21 (*)    Glucose, Bld 120 (*)    All other components within normal limits  CBC - Abnormal; Notable for the following components:   WBC 11.5 (*)    RBC 3.29 (*)    MCV 112.8 (*)    MCH 38.3 (*)    All other components within normal limits  URINALYSIS, ROUTINE W REFLEX MICROSCOPIC - Abnormal; Notable for the following components:   APPearance HAZY (*)    Ketones, ur 20 (*)    Protein, ur 30 (*)    All other components within normal limits  RESP PANEL BY RT-PCR (FLU A&B, COVID) ARPGX2  I-STAT BETA HCG BLOOD, ED ([facility], [facility], [facility] ONLY)    EKG None  Radiology US Abdomen Limited RUQ (LIVER/GB)  Result Date: 06/06/2020 CLINICAL DATA:  Right upper quadrant pain since last night. EXAM: ULTRASOUND ABDOMEN LIMITED RIGHT UPPER QUADRANT COMPARISON:  None. FINDINGS: Gallbladder: No gallstones or wall thickening visualized. Small amount of gallbladder sludge. No sonographic Murphy sign noted by sonographer. Common bile duct: Diameter: 1.9 mm Liver: No focal lesion identified. Within normal limits in parenchymal echogenicity. Portal vein is patent on color Doppler imaging with normal direction of blood flow towards the  liver. Other: None. IMPRESSION: No cholelithiasis or sonographic evidence of acute cholecystitis. Electronically Signed   By: Elige Ko   On: 06/06/2020 13:32    Procedures Procedures   Medications Ordered in ED Medications  lactated ringers infusion ( Intravenous New Bag/Given 06/06/20 1400)  fentaNYL (SUBLIMAZE) injection 50 mcg (50 mcg Intravenous Given 06/06/20 1228)  lactated ringers bolus 1,000 mL (0 mLs Intravenous Stopped 06/06/20  1350)  ondansetron (ZOFRAN) injection 4 mg (4 mg Intravenous Given 06/06/20 1228)  HYDROmorphone (DILAUDID) injection 0.5 mg (0.5 mg Intravenous Given 06/06/20 1355)    ED Course  I have reviewed the triage vital signs and the nursing notes.  Pertinent labs & imaging results that were available during my care of the patient were reviewed by me and considered in my medical decision making (see chart for details).    MDM Rules/Calculators/A&P                           Ddx: biliary colic, symptomatic cholelithiasis, cholecystitis, pancreatitis, PUD nonperitonitic but tender abdominal exam No lower quadrant pain to suggest appendicitis or ovarian pathology No fever to suggest sepsis Labs and RUQ [place] ordered Fluids, antiemetics, pain medicine ordered  Labs show elevated lipase. Pt endorses drinking over the weekend US unremarkable; no cholelithiasis, negative sonographic murphy's. Repeat eval; still nauseous and having pain. Given VSS, nonperitonitic exam, CT not indicated. Admitted to medicine for pancreatitis.  Final Clinical Impression(s) / ED Diagnoses Final diagnoses:  RUQ abdominal pain    Rx / DC Orders ED Discharge Orders    None       Jacklynn Bue, MD 06/06/20 1411    Cheryll Cockayne, MD 06/06/20 1434

## 2020-06-06 NOTE — Hospital Course (Addendum)
Chief Complaint: Stomach Pain   Hospital Course: Rebecca Cervantes presented to the ED with abdominal pain. Her symptoms started two days ago at 0600, which she initially assumed was gas. Her pain started on the right side and radiated to her back bilaterally over the course of the day. She characterized the pain as a stabbing pain. She attempted gas x and moving her body in different positions throughout the day with little relief. Her pain initially was "uncomfortable" and 8.5/10 on admission. She did receive dilaudid which brought her pain down to 6/10. Her pain now is 8/10. She also endorses nausea and vomiting, productive for stomach bile. She has vomited twice in the ED. She has not eaten since yesterday.    Progress Note 5/4 Drank water this morning and stomach felt very bloated afterwards. Nurse told patient to hold off on water until bloating subsides. Nausea and pain medications have been working well. History of abdominal pain and difficulty gaining weight, but could not determine cause in the past.

## 2020-06-06 NOTE — ED Triage Notes (Signed)
Patient states that she has abdominal pain that radiates across stomach to both side of her back.  Patient states that it started yesterday.  States that she has had nausea, vomiting, diarrhea.

## 2020-06-07 DIAGNOSIS — R1011 Right upper quadrant pain: Secondary | ICD-10-CM | POA: Diagnosis not present

## 2020-06-07 DIAGNOSIS — M549 Dorsalgia, unspecified: Secondary | ICD-10-CM | POA: Diagnosis present

## 2020-06-07 DIAGNOSIS — D509 Iron deficiency anemia, unspecified: Secondary | ICD-10-CM | POA: Diagnosis present

## 2020-06-07 DIAGNOSIS — F17210 Nicotine dependence, cigarettes, uncomplicated: Secondary | ICD-10-CM | POA: Diagnosis present

## 2020-06-07 DIAGNOSIS — K828 Other specified diseases of gallbladder: Secondary | ICD-10-CM | POA: Diagnosis not present

## 2020-06-07 DIAGNOSIS — Z825 Family history of asthma and other chronic lower respiratory diseases: Secondary | ICD-10-CM | POA: Diagnosis not present

## 2020-06-07 DIAGNOSIS — G8929 Other chronic pain: Secondary | ICD-10-CM | POA: Diagnosis present

## 2020-06-07 DIAGNOSIS — Z20822 Contact with and (suspected) exposure to covid-19: Secondary | ICD-10-CM | POA: Diagnosis present

## 2020-06-07 DIAGNOSIS — Z8249 Family history of ischemic heart disease and other diseases of the circulatory system: Secondary | ICD-10-CM | POA: Diagnosis not present

## 2020-06-07 DIAGNOSIS — Z885 Allergy status to narcotic agent status: Secondary | ICD-10-CM | POA: Diagnosis not present

## 2020-06-07 DIAGNOSIS — K852 Alcohol induced acute pancreatitis without necrosis or infection: Secondary | ICD-10-CM | POA: Diagnosis not present

## 2020-06-07 LAB — CBC
HCT: 36.1 % (ref 36.0–46.0)
Hemoglobin: 12.3 g/dL (ref 12.0–15.0)
MCH: 38.2 pg — ABNORMAL HIGH (ref 26.0–34.0)
MCHC: 34.1 g/dL (ref 30.0–36.0)
MCV: 112.1 fL — ABNORMAL HIGH (ref 80.0–100.0)
Platelets: 163 10*3/uL (ref 150–400)
RBC: 3.22 MIL/uL — ABNORMAL LOW (ref 3.87–5.11)
RDW: 12 % (ref 11.5–15.5)
WBC: 9.3 10*3/uL (ref 4.0–10.5)
nRBC: 0 % (ref 0.0–0.2)

## 2020-06-07 LAB — COMPREHENSIVE METABOLIC PANEL
ALT: 11 U/L (ref 0–44)
AST: 24 U/L (ref 15–41)
Albumin: 3.2 g/dL — ABNORMAL LOW (ref 3.5–5.0)
Alkaline Phosphatase: 36 U/L — ABNORMAL LOW (ref 38–126)
Anion gap: 13 (ref 5–15)
BUN: 9 mg/dL (ref 6–20)
CO2: 20 mmol/L — ABNORMAL LOW (ref 22–32)
Calcium: 8.5 mg/dL — ABNORMAL LOW (ref 8.9–10.3)
Chloride: 101 mmol/L (ref 98–111)
Creatinine, Ser: 0.69 mg/dL (ref 0.44–1.00)
GFR, Estimated: >60 mL/min (ref >60)
Glucose, Bld: 81 mg/dL (ref 70–99)
Potassium: 4 mmol/L (ref 3.5–5.1)
Sodium: 134 mmol/L — ABNORMAL LOW (ref 135–145)
Total Bilirubin: 1.2 mg/dL (ref 0.3–1.2)
Total Protein: 5.5 g/dL — ABNORMAL LOW (ref 6.5–8.1)

## 2020-06-07 MED ORDER — HYDROMORPHONE HCL 1 MG/ML IJ SOLN
1.0000 mg | INTRAMUSCULAR | Status: DC | PRN
  Administered 2020-06-07 – 2020-06-09 (×11): 1 mg via INTRAVENOUS
  Filled 2020-06-07 (×11): qty 1

## 2020-06-07 NOTE — Plan of Care (Signed)

## 2020-06-07 NOTE — Progress Notes (Signed)
   Subjective: feeling slightly better, abd pain still however rated at 8/10 prior to dilaudid and 6/10 after.  Objective:  Vital signs in last 24 hours: Vitals:   06/06/20 1945 06/06/20 2241 06/07/20 0754 06/07/20 0800  BP: 108/70 102/75 (!) 98/58 93/71  Pulse: 67 65 62 65  Resp:  19 (!) 21 17  Temp:  98 F (36.7 C) 98.7 F (37.1 C)   TempSrc:  Oral Oral   SpO2: 100% 99% 100% 100%   General: resting in bed, NAD HEENT: no scleral icterus Cardiac: RRR, no rubs, murmurs or gallops Pulm: clear to auscultation bilaterally, moving normal volumes of air Abd: soft, nondistended, BS present, bilateral upper quadrant tenderness with light palpation Ext: warm and well perfused, no pedal edema Neuro: alert and oriented X3  Assessment/Plan:   Pancreatitis - suspect due to ETOH -LR at 150cc/ hr -NPO, advance diet as appetite returns -Dilaudid 1mg  q4 PRN   Prior to Admission Living Arrangement: home Anticipated Discharge Location:home Barriers to Discharge: PO intake Dispo: Anticipated discharge in approximately1-2  day(s).   , DO 06/07/2020, 3:32 PM Pager: [phone] After 5pm on weekdays and 1pm on weekends: On Call pager [phone]

## 2020-06-08 NOTE — Progress Notes (Signed)
   Subjective:  Patient drank water this morning and stomach felt very bloated afterwards. Nurse told patient to hold off on water until bloating subsides. Nausea and pain medications have been working well. History of abdominal pain and difficulty gaining weight, but could not determine cause in the past. Her kids are anxious for her to get home.  Objective:  Vital signs in last 24 hours: Vitals:   06/07/20 2111 06/07/20 2328 06/08/20 0405 06/08/20 0829  BP: 101/66 100/68 102/72 101/68  Pulse: (!) 59 62 62 66  Resp: 16 16 14 18   Temp: 98.2 F (36.8 C) 98 F (36.7 C) 98.5 F (36.9 C) 98.5 F (36.9 C)  TempSrc: Oral Oral Oral Oral  SpO2: 100% 100%  100%   Weight change:   Intake/Output Summary (Last 24 hours) at 06/08/2020 1145 Last data filed at 06/08/2020 0317 Gross per 24 hour  Intake 4441.03 ml  Output --  Net 4441.03 ml   Physical Exam Constitutional:      Appearance: She is well-developed.  HENT:     Head: Normocephalic and atraumatic.  Eyes:     Extraocular Movements: Extraocular movements intact.  Cardiovascular:     Rate and Rhythm: Normal rate and regular rhythm.     Heart sounds: Normal heart sounds.  Pulmonary:     Effort: Pulmonary effort is normal.     Breath sounds: Normal breath sounds.  Abdominal:     General: Abdomen is protuberant. Bowel sounds are normal.     Palpations: Abdomen is soft.     Tenderness: There is generalized abdominal tenderness.  Skin:    General: Skin is warm and dry.  Neurological:     General: No focal deficit present.     Mental Status: She is alert and oriented to person, place, and time.  Psychiatric:        Mood and Affect: Mood normal.        Behavior: Behavior normal.    Assessment/Plan:  Active Problems:   Pancreatitis  1. Pancreatitis suspected 2/2 alcohol use Patient is bloated this morning after drinking some water. She is otherwise doing well and is afebrile. Pain and nausea is controlled. -Will monitor PO  intake tonight -If intake improving tomorrow, will consider discharge -Continue Dilaudid 1 mg Q4H and Zofran 4 mg Q4H prn -Will stop IV LR since nominal benefit at this point   LOS: 1 day   08/08/2020, Medical Student 06/08/2020, 11:45 AM Pager: [phone] After 5pm on weekdays and 1pm on weekends: On Call pager [phone]

## 2020-06-09 DIAGNOSIS — K852 Alcohol induced acute pancreatitis without necrosis or infection: Principal | ICD-10-CM

## 2020-06-09 LAB — BASIC METABOLIC PANEL
Anion gap: 13 (ref 5–15)
BUN: 5 mg/dL — ABNORMAL LOW (ref 6–20)
CO2: 22 mmol/L (ref 22–32)
Calcium: 8.4 mg/dL — ABNORMAL LOW (ref 8.9–10.3)
Chloride: 98 mmol/L (ref 98–111)
Creatinine, Ser: 0.63 mg/dL (ref 0.44–1.00)
GFR, Estimated: >60 mL/min (ref >60)
Glucose, Bld: 61 mg/dL — ABNORMAL LOW (ref 70–99)
Potassium: 4.1 mmol/L (ref 3.5–5.1)
Sodium: 133 mmol/L — ABNORMAL LOW (ref 135–145)

## 2020-06-09 MED ORDER — SIMETHICONE 80 MG PO CHEW
80.0000 mg | CHEWABLE_TABLET | Freq: Three times a day (TID) | ORAL | Status: DC | PRN
  Filled 2020-06-09: qty 1

## 2020-06-09 NOTE — Plan of Care (Signed)
  Problem: Education: Goal: Knowledge of General Education information will improve Description: Including pain rating scale, medication(s)/side effects and non-pharmacologic comfort measures Outcome: Adequate for Discharge   Problem: Health Behavior/Discharge Planning: Goal: Ability to manage health-related needs will improve Outcome: Adequate for Discharge   Problem: Activity: Goal: Risk for activity intolerance will decrease Outcome: Adequate for Discharge   Problem: Nutrition: Goal: Adequate nutrition will be maintained Outcome: Adequate for Discharge   Problem: Coping: Goal: Level of anxiety will decrease Outcome: Adequate for Discharge   Problem: Elimination: Goal: Will not experience complications related to bowel motility Outcome: Adequate for Discharge Goal: Will not experience complications related to urinary retention Outcome: Adequate for Discharge   Problem: Pain Managment: Goal: General experience of comfort will improve Outcome: Adequate for Discharge   Problem: Safety: Goal: Ability to remain free from injury will improve Outcome: Adequate for Discharge   Problem: Skin Integrity: Goal: Risk for impaired skin integrity will decrease Outcome: Adequate for Discharge   

## 2020-06-09 NOTE — Discharge Summary (Signed)
Name: Rebecca Cervantes MRN: [id number] DOB: 04/01/1995 25 y.o. PCP: Medicine, [facility]  Date of Admission: 06/06/2020 10:09 AM Date of Discharge:  Attending Physician: Gust Rung, DO  Discharge Diagnosis: 1. Alcoholic pancreatitis  Discharge Medications: Allergies as of 06/09/2020      Reactions   Morphine And Related Hives      Medication List    STOP taking these medications   acetaminophen 325 MG tablet Commonly known as: Tylenol   dicyclomine 10 MG capsule Commonly known as: BENTYL   norethindrone 0.35 MG tablet Commonly known as: MICRONOR      Disposition and follow-up:   Ms.Tiyona L Corne was discharged from [facility] in Stable condition.  At the hospital follow up visit please address:  1. Alcoholic hepatitis: Please evaluate for improving pain and that she is tolerating diet.   2.  Labs / imaging needed at time of follow-up: None  3.  Pending labs/ test needing follow-up: None  Follow-up Appointments:  Patient will follow up with primary care doctors  Hospital Course by problem list:  Acholic pancreatitis 25 year old female with past medical history of chronic abdominal pain, chronic back pain and iron deficiency anemia who presented with abdominal pain after having several shots of liquor the night before. Found to have elevated lipase and admitted for pancreatitis secondary to alcohol use. She was treated with IV fluids and pain medication with improvement in pain. Patient slowly improved on day of discharge she was tolerating fluids and soft foods. She is eager for discharge home. Advised she continue to slowly advance diet as tolerated and abstain   Subjective: Examined at bedside. Today she was able to drink juice and have some pudding with much less pain. Feels that her pain is slowly improving and appetite is returning. Patient very much would like to go home today. Discussed she should continue to drink  fluids to stay hydrated and advance diet as tolerated at home. Also discussed abstaining from alcohol. Patient expressed she ahs no desire to drink alcohol in the near future.   Discharge Exam:   BP 119/89 (BP Location: Right Arm)   Pulse 60   Temp 98.5 F (36.9 C) (Oral)   Resp 18   SpO2 100%  Discharge exam:   General: resting in bed, in no acute distress, appears energetic HEENT: no scleral icterus, mucous membranes moist Cardiac: RRR, no rubs, murmurs or gallops Pulm: clear to auscultation bilaterally, moving normal volumes of air Abd: soft, nondistended, BS present, Minimal tenderness to palpation of the LUQ Ext: warm and well perfused, no pedal edema Neuro: alert and oriented X3  Pertinent Labs, Studies, and Procedures:  US Abdomen Limited RUQ (LIVER/GB)  Result Date: 06/06/2020 CLINICAL DATA:  Right upper quadrant pain since last night. EXAM: ULTRASOUND ABDOMEN LIMITED RIGHT UPPER QUADRANT COMPARISON:  None. FINDINGS: Gallbladder: No gallstones or wall thickening visualized. Small amount of gallbladder sludge. No sonographic Murphy sign noted by sonographer. Common bile duct: Diameter: 1.9 mm Liver: No focal lesion identified. Within normal limits in parenchymal echogenicity. Portal vein is patent on color Doppler imaging with normal direction of blood flow towards the liver. Other: None. IMPRESSION: No cholelithiasis or sonographic evidence of acute cholecystitis. Electronically Signed   By: Elige Ko   On: 06/06/2020 13:32    Discharge Instructions: Discharge Instructions    Call MD for:  persistant nausea and vomiting   Complete by: As directed    Call MD for:  severe  uncontrolled pain   Complete by: As directed    Diet - low sodium heart healthy   Complete by: As directed    Diet general   Complete by: As directed    Discharge instructions   Complete by: As directed    Discharge instructions   Complete by: As directed    Ms. Rauh,  It was a pleasure taking  care of you here in the hospital.  You were admitted because of pancreatitis from alcohol use.  You were treated with IV fluids and pain medicines.  Going forward, please abstain from alcohol use and follow-up with your primary doctor.  Take care!   Increase activity slowly   Complete by: As directed    Increase activity slowly   Complete by: As directed       Signed: Quincy Simmonds, MD 06/09/2020, 2:29 PM   Pager: [phone]

## 2020-06-10 ENCOUNTER — Telehealth: Payer: Self-pay | Admitting: *Deleted

## 2020-06-10 NOTE — Telephone Encounter (Signed)
Transition Care Management Follow-up Telephone Call  Date of discharge and from where: 06/09/2020 - [facility]  How have you been since you were released from the hospital? "Feeling better"  Any questions or concerns? No  Items Reviewed:  Did the pt receive and understand the discharge instructions provided? Yes   Medications obtained and verified? Yes   Other? No   Any new allergies since your discharge? No   Dietary orders reviewed? No  Do you have support at home? Yes    Functional Questionnaire: (I = Independent and D = Dependent) ADLs: I  Bathing/Dressing- I  Meal Prep- I  Eating- I  Maintaining continence- I  Transferring/Ambulation- I  Managing Meds- I  Follow up appointments reviewed:   PCP Hospital f/u appt confirmed? No  - PCP outside of [facility] f/u appt confirmed? N/A   Are transportation arrangements needed? N//A  If their condition worsens, is the pt aware to call PCP or go to the Emergency Dept.? Yes  Was the patient provided with contact information for the PCP's office or ED? Yes  Was to pt encouraged to call back with questions or concerns? Yes

## 2021-05-30 ENCOUNTER — Ambulatory Visit: Payer: Medicaid Other | Admitting: Family Medicine

## 2021-05-30 ENCOUNTER — Telehealth: Payer: Self-pay | Admitting: Family Medicine

## 2021-05-30 NOTE — Telephone Encounter (Signed)
Pt was a no show for her NP app with Dr. Janee Morn on 05/30/21, I sent a no show letter.  ?

## 2021-06-02 ENCOUNTER — Encounter: Payer: Medicaid Other | Admitting: Obstetrics

## 2021-06-07 ENCOUNTER — Ambulatory Visit: Payer: Medicaid Other | Admitting: Nurse Practitioner

## 2021-06-13 NOTE — Telephone Encounter (Signed)
No show x 1 ?Cancelled x 1 ?No longer taking Medicaid pts ?Dismissed from office ?

## 2021-06-14 ENCOUNTER — Encounter: Payer: Medicaid Other | Admitting: Obstetrics & Gynecology

## 2021-07-25 ENCOUNTER — Encounter: Payer: Medicaid Other | Admitting: Obstetrics & Gynecology
